# Patient Record
Sex: Male | Born: 1945 | Race: White | Hispanic: No | Marital: Married | Smoking: Former smoker
Health system: Southern US, Community
[De-identification: ages and names within clinical notes are randomized; demographics above are authoritative.]

## PROBLEM LIST (undated history)

## (undated) DIAGNOSIS — K5790 Diverticulosis of intestine, part unspecified, without perforation or abscess without bleeding: Secondary | ICD-10-CM

## (undated) DIAGNOSIS — Z9289 Personal history of other medical treatment: Secondary | ICD-10-CM

## (undated) DIAGNOSIS — N400 Enlarged prostate without lower urinary tract symptoms: Secondary | ICD-10-CM

## (undated) DIAGNOSIS — H35349 Macular cyst, hole, or pseudohole, unspecified eye: Secondary | ICD-10-CM

## (undated) DIAGNOSIS — Z85828 Personal history of other malignant neoplasm of skin: Secondary | ICD-10-CM

## (undated) DIAGNOSIS — J9383 Other pneumothorax: Secondary | ICD-10-CM

## (undated) DIAGNOSIS — G4730 Sleep apnea, unspecified: Secondary | ICD-10-CM

## (undated) DIAGNOSIS — N3281 Overactive bladder: Secondary | ICD-10-CM

## (undated) DIAGNOSIS — Z720 Tobacco use: Secondary | ICD-10-CM

## (undated) DIAGNOSIS — K922 Gastrointestinal hemorrhage, unspecified: Secondary | ICD-10-CM

## (undated) DIAGNOSIS — K219 Gastro-esophageal reflux disease without esophagitis: Secondary | ICD-10-CM

## (undated) HISTORY — PX: CYST EXCISION: SHX5701

## (undated) HISTORY — PX: OTHER SURGICAL HISTORY: SHX169

## (undated) HISTORY — PX: COLONOSCOPY: SHX174

---
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
## 2012-05-31 DIAGNOSIS — C61 Malignant neoplasm of prostate: Secondary | ICD-10-CM | POA: Insufficient documentation

## 2012-09-25 DIAGNOSIS — K5730 Diverticulosis of large intestine without perforation or abscess without bleeding: Secondary | ICD-10-CM | POA: Insufficient documentation

## 2013-03-27 ENCOUNTER — Encounter (INDEPENDENT_AMBULATORY_CARE_PROVIDER_SITE_OTHER): Payer: Medicare Other | Admitting: Ophthalmology

## 2013-03-27 DIAGNOSIS — H43819 Vitreous degeneration, unspecified eye: Secondary | ICD-10-CM

## 2013-03-27 DIAGNOSIS — H2510 Age-related nuclear cataract, unspecified eye: Secondary | ICD-10-CM

## 2013-03-27 DIAGNOSIS — H35439 Paving stone degeneration of retina, unspecified eye: Secondary | ICD-10-CM

## 2013-03-27 DIAGNOSIS — H3530 Unspecified macular degeneration: Secondary | ICD-10-CM

## 2013-05-01 ENCOUNTER — Ambulatory Visit: Payer: Self-pay | Admitting: Family Medicine

## 2014-04-02 ENCOUNTER — Encounter (INDEPENDENT_AMBULATORY_CARE_PROVIDER_SITE_OTHER): Payer: Medicare Other | Admitting: Ophthalmology

## 2014-04-02 DIAGNOSIS — H43813 Vitreous degeneration, bilateral: Secondary | ICD-10-CM

## 2014-04-02 DIAGNOSIS — H3531 Nonexudative age-related macular degeneration: Secondary | ICD-10-CM

## 2014-04-02 DIAGNOSIS — H35342 Macular cyst, hole, or pseudohole, left eye: Secondary | ICD-10-CM

## 2014-04-03 NOTE — H&P (Signed)
Patrick Shepherd is an 68 y.o. male.   Chief Complaint:severe loss of vision left eye HPI: lost vision left eye over past 4 months  No past medical history on file.  No past surgical history on file.  No family history on file. Social History:  has no tobacco, alcohol, and drug history on file.  Allergies: Allergies not on file  No prescriptions prior to admission    Review of systems otherwise negative  There were no vitals taken for this visit.  Physical exam: Mental status: oriented x3. Eyes: See eye exam associated with this date of surgery in media tab.  Scanned in by scanning center Ears, Nose, Throat: within normal limits Neck: Within Normal limits General: within normal limits Chest: Within normal limits Breast: deferred Heart: Within normal limits Abdomen: Within normal limits GU: deferred Extremities: within normal limits Skin: within normal limits  Assessment/Plan Macular hole left eye Plan: To [facility] for Pars plana vitrectomy, membrane peel, laser, serum patch, gas injection left eye  Patrick Shepherd D 04/03/2014, 7:32 AM

## 2014-04-20 ENCOUNTER — Encounter (HOSPITAL_COMMUNITY): Payer: Self-pay | Admitting: *Deleted

## 2014-04-20 MED ORDER — TROPICAMIDE 1 % OP SOLN
1.0000 [drp] | OPHTHALMIC | Status: DC | PRN
  Filled 2014-04-20: qty 3

## 2014-04-20 MED ORDER — CYCLOPENTOLATE HCL 1 % OP SOLN
1.0000 [drp] | OPHTHALMIC | Status: DC | PRN
  Filled 2014-04-20: qty 2

## 2014-04-20 MED ORDER — CEFAZOLIN SODIUM-DEXTROSE 2-3 GM-% IV SOLR
2.0000 g | INTRAVENOUS | Status: AC
  Administered 2014-04-21: 2 g via INTRAVENOUS
  Filled 2014-04-20: qty 50

## 2014-04-20 MED ORDER — GATIFLOXACIN 0.5 % OP SOLN
1.0000 [drp] | OPHTHALMIC | Status: DC | PRN
  Filled 2014-04-20: qty 2.5

## 2014-04-20 MED ORDER — PHENYLEPHRINE HCL 2.5 % OP SOLN
1.0000 [drp] | OPHTHALMIC | Status: DC | PRN
  Filled 2014-04-20: qty 2

## 2014-04-21 ENCOUNTER — Encounter (HOSPITAL_COMMUNITY): Payer: Self-pay | Admitting: *Deleted

## 2014-04-21 ENCOUNTER — Ambulatory Visit (HOSPITAL_COMMUNITY): Payer: Medicare Other | Admitting: Anesthesiology

## 2014-04-21 ENCOUNTER — Ambulatory Visit (HOSPITAL_COMMUNITY)
Admission: RE | Admit: 2014-04-21 | Discharge: 2014-04-22 | Disposition: A | Discharge status: Home / Self Care | Payer: Medicare Other | Source: Ambulatory Visit | Attending: Ophthalmology | Admitting: Ophthalmology

## 2014-04-21 ENCOUNTER — Encounter (HOSPITAL_COMMUNITY)
Admission: RE | Disposition: A | Discharge status: Home / Self Care | Payer: Self-pay | Source: Ambulatory Visit | Attending: Ophthalmology

## 2014-04-21 DIAGNOSIS — F17210 Nicotine dependence, cigarettes, uncomplicated: Secondary | ICD-10-CM | POA: Diagnosis not present

## 2014-04-21 DIAGNOSIS — H35342 Macular cyst, hole, or pseudohole, left eye: Secondary | ICD-10-CM | POA: Diagnosis not present

## 2014-04-21 DIAGNOSIS — K219 Gastro-esophageal reflux disease without esophagitis: Secondary | ICD-10-CM | POA: Diagnosis not present

## 2014-04-21 DIAGNOSIS — G4730 Sleep apnea, unspecified: Secondary | ICD-10-CM | POA: Insufficient documentation

## 2014-04-21 DIAGNOSIS — H35349 Macular cyst, hole, or pseudohole, unspecified eye: Secondary | ICD-10-CM

## 2014-04-21 HISTORY — PX: 25 GAUGE PARS PLANA VITRECTOMY WITH 20 GAUGE MVR PORT FOR MACULAR HOLE: SHX6096

## 2014-04-21 HISTORY — PX: GAS/FLUID EXCHANGE: SHX5334

## 2014-04-21 HISTORY — PX: MEMBRANE PEEL: SHX5967

## 2014-04-21 HISTORY — DX: Benign prostatic hyperplasia without lower urinary tract symptoms: N40.0

## 2014-04-21 HISTORY — PX: SERUM PATCH: SHX6091

## 2014-04-21 HISTORY — DX: Sleep apnea, unspecified: G47.30

## 2014-04-21 HISTORY — DX: Overactive bladder: N32.81

## 2014-04-21 HISTORY — DX: Gastro-esophageal reflux disease without esophagitis: K21.9

## 2014-04-21 HISTORY — DX: Personal history of other medical treatment: Z92.89

## 2014-04-21 LAB — BASIC METABOLIC PANEL
Anion gap: 13 (ref 5–15)
BUN: 10 mg/dL (ref 6–23)
CHLORIDE: 104 meq/L (ref 96–112)
CO2: 24 mEq/L (ref 19–32)
Calcium: 9.5 mg/dL (ref 8.4–10.5)
Creatinine, Ser: 0.93 mg/dL (ref 0.50–1.35)
GFR calc Af Amer: >90 mL/min (ref >90)
GFR, EST NON AFRICAN AMERICAN: 84 mL/min — AB (ref >90)
GLUCOSE: 103 mg/dL — AB (ref 70–99)
POTASSIUM: 4.4 meq/L (ref 3.7–5.3)
SODIUM: 141 meq/L (ref 137–147)

## 2014-04-21 LAB — AUTOLOGOUS SERUM PATCH PREP

## 2014-04-21 LAB — CBC
HCT: 46.2 % (ref 39.0–52.0)
HEMOGLOBIN: 16.3 g/dL (ref 13.0–17.0)
MCH: 34.3 pg — ABNORMAL HIGH (ref 26.0–34.0)
MCHC: 35.3 g/dL (ref 30.0–36.0)
MCV: 97.3 fL (ref 78.0–100.0)
Platelets: 195 10*3/uL (ref 150–400)
RBC: 4.75 MIL/uL (ref 4.22–5.81)
RDW: 13 % (ref 11.5–15.5)
WBC: 5.5 10*3/uL (ref 4.0–10.5)

## 2014-04-21 SURGERY — 25 GAUGE PARS PLANA VITRECTOMY WITH 20 GAUGE MVR PORT FOR MACULAR HOLE
Anesthesia: General | Site: Eye | Laterality: Left

## 2014-04-21 MED ORDER — SODIUM HYALURONATE 10 MG/ML IO SOLN
INTRAOCULAR | Status: DC | PRN
  Administered 2014-04-21: 0.85 mL via INTRAOCULAR

## 2014-04-21 MED ORDER — SODIUM CHLORIDE 0.9 % IV SOLN
INTRAVENOUS | Status: DC | PRN
  Administered 2014-04-21: 14:00:00 via INTRAVENOUS

## 2014-04-21 MED ORDER — SODIUM HYALURONATE 10 MG/ML IO SOLN
INTRAOCULAR | Status: AC
  Filled 2014-04-21: qty 0.85

## 2014-04-21 MED ORDER — NEOSTIGMINE METHYLSULFATE 10 MG/10ML IV SOLN
INTRAVENOUS | Status: DC | PRN
  Administered 2014-04-21: 3 mg via INTRAVENOUS

## 2014-04-21 MED ORDER — PROPOFOL 10 MG/ML IV BOLUS
INTRAVENOUS | Status: AC
  Filled 2014-04-21: qty 20

## 2014-04-21 MED ORDER — SODIUM CHLORIDE 0.9 % IV SOLN: INTRAVENOUS | Status: DC

## 2014-04-21 MED ORDER — GLYCOPYRROLATE 0.2 MG/ML IJ SOLN
INTRAMUSCULAR | Status: AC
  Filled 2014-04-21: qty 1

## 2014-04-21 MED ORDER — BUPIVACAINE HCL (PF) 0.75 % IJ SOLN
INTRAMUSCULAR | Status: AC
  Filled 2014-04-21: qty 10

## 2014-04-21 MED ORDER — GATIFLOXACIN 0.5 % OP SOLN
1.0000 [drp] | Freq: Four times a day (QID) | OPHTHALMIC | Status: DC
  Filled 2014-04-21: qty 2.5

## 2014-04-21 MED ORDER — MEPERIDINE HCL 25 MG/ML IJ SOLN: 6.2500 mg | INTRAMUSCULAR | Status: DC | PRN

## 2014-04-21 MED ORDER — TAMSULOSIN HCL 0.4 MG PO CAPS
0.4000 mg | ORAL_CAPSULE | Freq: Every day | ORAL | Status: DC
  Administered 2014-04-21: 0.4 mg via ORAL
  Filled 2014-04-21 (×2): qty 1

## 2014-04-21 MED ORDER — DEXAMETHASONE SODIUM PHOSPHATE 4 MG/ML IJ SOLN
INTRAMUSCULAR | Status: AC
  Filled 2014-04-21: qty 2

## 2014-04-21 MED ORDER — TROPICAMIDE 1 % OP SOLN
1.0000 [drp] | OPHTHALMIC | Status: AC | PRN
  Administered 2014-04-21 (×3): 1 [drp] via OPHTHALMIC

## 2014-04-21 MED ORDER — MORPHINE SULFATE 2 MG/ML IJ SOLN
1.0000 mg | INTRAMUSCULAR | Status: DC | PRN
  Administered 2014-04-21: 2 mg via INTRAVENOUS
  Filled 2014-04-21: qty 1

## 2014-04-21 MED ORDER — ONDANSETRON HCL 4 MG/2ML IJ SOLN
INTRAMUSCULAR | Status: DC | PRN
  Administered 2014-04-21: 4 mg via INTRAVENOUS

## 2014-04-21 MED ORDER — ROCURONIUM BROMIDE 50 MG/5ML IV SOLN
INTRAVENOUS | Status: AC
  Filled 2014-04-21: qty 1

## 2014-04-21 MED ORDER — POLYMYXIN B SULFATE 500000 UNITS IJ SOLR
INTRAMUSCULAR | Status: AC
  Filled 2014-04-21: qty 1

## 2014-04-21 MED ORDER — TEMAZEPAM 15 MG PO CAPS: 15.0000 mg | ORAL_CAPSULE | Freq: Every evening | ORAL | Status: DC | PRN

## 2014-04-21 MED ORDER — GLYCOPYRROLATE 0.2 MG/ML IJ SOLN
INTRAMUSCULAR | Status: DC | PRN
  Administered 2014-04-21: 0.4 mg via INTRAVENOUS

## 2014-04-21 MED ORDER — MIDAZOLAM HCL 2 MG/2ML IJ SOLN: 0.5000 mg | Freq: Once | INTRAMUSCULAR | Status: DC | PRN

## 2014-04-21 MED ORDER — PROMETHAZINE HCL 25 MG/ML IJ SOLN: 6.2500 mg | INTRAMUSCULAR | Status: DC | PRN

## 2014-04-21 MED ORDER — OXYCODONE HCL 5 MG/5ML PO SOLN: 5.0000 mg | Freq: Once | ORAL | Status: DC | PRN

## 2014-04-21 MED ORDER — GATIFLOXACIN 0.5 % OP SOLN
1.0000 [drp] | OPHTHALMIC | Status: AC | PRN
  Administered 2014-04-21 (×3): 1 [drp] via OPHTHALMIC

## 2014-04-21 MED ORDER — BACITRACIN-POLYMYXIN B 500-10000 UNIT/GM OP OINT
TOPICAL_OINTMENT | OPHTHALMIC | Status: DC | PRN
  Administered 2014-04-21: 1 via OPHTHALMIC

## 2014-04-21 MED ORDER — GENTAMICIN SULFATE 40 MG/ML IJ SOLN
INTRAMUSCULAR | Status: AC
  Filled 2014-04-21: qty 2

## 2014-04-21 MED ORDER — EPINEPHRINE HCL 1 MG/ML IJ SOLN
INTRAOCULAR | Status: DC | PRN
  Administered 2014-04-21: 500 mL

## 2014-04-21 MED ORDER — MAGNESIUM HYDROXIDE 400 MG/5ML PO SUSP
15.0000 mL | Freq: Four times a day (QID) | ORAL | Status: DC | PRN
  Administered 2014-04-21: 30 mL via ORAL
  Filled 2014-04-21: qty 30

## 2014-04-21 MED ORDER — BSS PLUS IO SOLN
INTRAOCULAR | Status: AC
  Filled 2014-04-21: qty 500

## 2014-04-21 MED ORDER — PANTOPRAZOLE SODIUM 40 MG PO TBEC
40.0000 mg | DELAYED_RELEASE_TABLET | Freq: Every day | ORAL | Status: DC
  Administered 2014-04-21: 40 mg via ORAL
  Filled 2014-04-21: qty 1

## 2014-04-21 MED ORDER — ACETAMINOPHEN 325 MG PO TABS: 325.0000 mg | ORAL_TABLET | ORAL | Status: DC | PRN

## 2014-04-21 MED ORDER — FENTANYL CITRATE 0.05 MG/ML IJ SOLN
INTRAMUSCULAR | Status: AC
  Filled 2014-04-21: qty 5

## 2014-04-21 MED ORDER — BACITRACIN-POLYMYXIN B 500-10000 UNIT/GM OP OINT
TOPICAL_OINTMENT | OPHTHALMIC | Status: AC
  Filled 2014-04-21: qty 3.5

## 2014-04-21 MED ORDER — ONDANSETRON HCL 4 MG/2ML IJ SOLN
INTRAMUSCULAR | Status: AC
  Filled 2014-04-21: qty 2

## 2014-04-21 MED ORDER — ROCURONIUM BROMIDE 100 MG/10ML IV SOLN
INTRAVENOUS | Status: DC | PRN
  Administered 2014-04-21: 35 mg via INTRAVENOUS

## 2014-04-21 MED ORDER — TETRACAINE HCL 0.5 % OP SOLN
2.0000 [drp] | Freq: Once | OPHTHALMIC | Status: DC
  Filled 2014-04-21: qty 2

## 2014-04-21 MED ORDER — DEXAMETHASONE SODIUM PHOSPHATE 10 MG/ML IJ SOLN
INTRAMUSCULAR | Status: AC
  Filled 2014-04-21: qty 1

## 2014-04-21 MED ORDER — EPINEPHRINE HCL 1 MG/ML IJ SOLN
INTRAMUSCULAR | Status: AC
  Filled 2014-04-21: qty 1

## 2014-04-21 MED ORDER — 0.9 % SODIUM CHLORIDE (POUR BTL) OPTIME
TOPICAL | Status: DC | PRN
  Administered 2014-04-21: 1000 mL

## 2014-04-21 MED ORDER — MIDAZOLAM HCL 5 MG/5ML IJ SOLN
INTRAMUSCULAR | Status: DC | PRN
  Administered 2014-04-21: 2 mg via INTRAVENOUS

## 2014-04-21 MED ORDER — DOCUSATE SODIUM 100 MG PO CAPS
100.0000 mg | ORAL_CAPSULE | Freq: Two times a day (BID) | ORAL | Status: DC
  Administered 2014-04-21: 100 mg via ORAL
  Filled 2014-04-21: qty 1

## 2014-04-21 MED ORDER — PHENYLEPHRINE HCL 10 MG/ML IJ SOLN
INTRAMUSCULAR | Status: DC | PRN
  Administered 2014-04-21 (×6): 80 ug via INTRAVENOUS

## 2014-04-21 MED ORDER — CYCLOPENTOLATE HCL 1 % OP SOLN
1.0000 [drp] | OPHTHALMIC | Status: AC | PRN
  Administered 2014-04-21 (×3): 1 [drp] via OPHTHALMIC

## 2014-04-21 MED ORDER — FENTANYL CITRATE 0.05 MG/ML IJ SOLN
INTRAMUSCULAR | Status: DC | PRN
  Administered 2014-04-21 (×2): 100 ug via INTRAVENOUS
  Administered 2014-04-21: 50 ug via INTRAVENOUS

## 2014-04-21 MED ORDER — LATANOPROST 0.005 % OP SOLN
1.0000 [drp] | Freq: Every day | OPHTHALMIC | Status: DC
  Filled 2014-04-21: qty 2.5

## 2014-04-21 MED ORDER — SODIUM CHLORIDE 0.45 % IV SOLN
INTRAVENOUS | Status: DC
  Administered 2014-04-21: 17:00:00 via INTRAVENOUS

## 2014-04-21 MED ORDER — ACETAZOLAMIDE SODIUM 500 MG IJ SOLR
500.0000 mg | Freq: Once | INTRAMUSCULAR | Status: AC
  Administered 2014-04-22: 500 mg via INTRAVENOUS
  Filled 2014-04-21: qty 500

## 2014-04-21 MED ORDER — BACITRACIN-POLYMYXIN B 500-10000 UNIT/GM OP OINT
1.0000 "application " | TOPICAL_OINTMENT | Freq: Three times a day (TID) | OPHTHALMIC | Status: DC
  Filled 2014-04-21: qty 3.5

## 2014-04-21 MED ORDER — SODIUM CHLORIDE 0.9 % IJ SOLN
INTRAMUSCULAR | Status: AC
  Filled 2014-04-21: qty 10

## 2014-04-21 MED ORDER — ONDANSETRON HCL 4 MG/2ML IJ SOLN
4.0000 mg | Freq: Four times a day (QID) | INTRAMUSCULAR | Status: DC
  Administered 2014-04-21 – 2014-04-22 (×2): 4 mg via INTRAVENOUS
  Filled 2014-04-21 (×2): qty 2

## 2014-04-21 MED ORDER — LACTATED RINGERS IV SOLN
INTRAVENOUS | Status: DC | PRN
  Administered 2014-04-21: 14:00:00 via INTRAVENOUS

## 2014-04-21 MED ORDER — ATROPINE SULFATE 1 % OP SOLN
OPHTHALMIC | Status: AC
  Filled 2014-04-21: qty 2

## 2014-04-21 MED ORDER — BUPIVACAINE HCL (PF) 0.75 % IJ SOLN
INTRAMUSCULAR | Status: DC | PRN
  Administered 2014-04-21: 10 mL

## 2014-04-21 MED ORDER — FENTANYL CITRATE 0.05 MG/ML IJ SOLN: 25.0000 ug | INTRAMUSCULAR | Status: DC | PRN

## 2014-04-21 MED ORDER — MIDAZOLAM HCL 2 MG/2ML IJ SOLN
INTRAMUSCULAR | Status: AC
  Filled 2014-04-21: qty 2

## 2014-04-21 MED ORDER — BRIMONIDINE TARTRATE 0.2 % OP SOLN
1.0000 [drp] | Freq: Two times a day (BID) | OPHTHALMIC | Status: DC
  Filled 2014-04-21: qty 5

## 2014-04-21 MED ORDER — PREDNISOLONE ACETATE 1 % OP SUSP
1.0000 [drp] | Freq: Four times a day (QID) | OPHTHALMIC | Status: DC
  Filled 2014-04-21: qty 5
  Filled 2014-04-21: qty 1

## 2014-04-21 MED ORDER — HYDROCODONE-ACETAMINOPHEN 5-325 MG PO TABS
1.0000 | ORAL_TABLET | ORAL | Status: DC | PRN
  Administered 2014-04-21: 2 via ORAL
  Filled 2014-04-21: qty 2

## 2014-04-21 MED ORDER — LIDOCAINE HCL (CARDIAC) 20 MG/ML IV SOLN
INTRAVENOUS | Status: AC
  Filled 2014-04-21: qty 5

## 2014-04-21 MED ORDER — OXYCODONE HCL 5 MG PO TABS: 5.0000 mg | ORAL_TABLET | Freq: Once | ORAL | Status: DC | PRN

## 2014-04-21 MED ORDER — PHENYLEPHRINE HCL 2.5 % OP SOLN
1.0000 [drp] | OPHTHALMIC | Status: AC | PRN
  Administered 2014-04-21 (×3): 1 [drp] via OPHTHALMIC

## 2014-04-21 MED ORDER — TRIAMCINOLONE ACETONIDE 40 MG/ML IJ SUSP
INTRAMUSCULAR | Status: AC
  Filled 2014-04-21: qty 5

## 2014-04-21 MED ORDER — DEXAMETHASONE SODIUM PHOSPHATE 10 MG/ML IJ SOLN
INTRAMUSCULAR | Status: DC | PRN
  Administered 2014-04-21: 10 mg via INTRAVENOUS

## 2014-04-21 MED ORDER — PROPOFOL 10 MG/ML IV BOLUS
INTRAVENOUS | Status: DC | PRN
  Administered 2014-04-21: 150 mg via INTRAVENOUS

## 2014-04-21 MED ORDER — LIDOCAINE HCL (CARDIAC) 20 MG/ML IV SOLN
INTRAVENOUS | Status: DC | PRN
  Administered 2014-04-21: 20 mg via INTRAVENOUS

## 2014-04-21 MED ORDER — DARIFENACIN HYDROBROMIDE ER 15 MG PO TB24
15.0000 mg | ORAL_TABLET | Freq: Every day | ORAL | Status: DC
  Administered 2014-04-21: 15 mg via ORAL
  Filled 2014-04-21 (×2): qty 1

## 2014-04-21 SURGICAL SUPPLY — 45 items
BLADE MVR KNIFE 20G (BLADE) ×3 IMPLANT
CANNULA VLV SOFT TIP 25GA (OPHTHALMIC) ×3 IMPLANT
CORDS BIPOLAR (ELECTRODE) ×3 IMPLANT
COTTONBALL LRG STERILE PKG (GAUZE/BANDAGES/DRESSINGS) ×9 IMPLANT
COVER MAYO STAND STRL (DRAPES) ×3 IMPLANT
DRAPE INCISE 51X51 W/FILM STRL (DRAPES) ×3 IMPLANT
DRAPE OPHTHALMIC 77X100 STRL (CUSTOM PROCEDURE TRAY) ×3 IMPLANT
FILTER STRAW FLUID ASPIR (MISCELLANEOUS) ×3 IMPLANT
GAS AUTO FILL CONSTEL (OPHTHALMIC) ×3
GAS AUTO FILL CONSTELLATION (OPHTHALMIC) ×1 IMPLANT
GLOVE SS BIOGEL STRL SZ 6.5 (GLOVE) ×1 IMPLANT
GLOVE SS BIOGEL STRL SZ 7 (GLOVE) ×1 IMPLANT
GLOVE SUPERSENSE BIOGEL SZ 6.5 (GLOVE) ×2
GLOVE SUPERSENSE BIOGEL SZ 7 (GLOVE) ×2
GLOVE SURG 8.5 LATEX PF (GLOVE) ×3 IMPLANT
GLOVE SURG SS PI 7.0 STRL IVOR (GLOVE) ×3 IMPLANT
GOWN STRL REUS W/ TWL LRG LVL3 (GOWN DISPOSABLE) ×3 IMPLANT
GOWN STRL REUS W/TWL LRG LVL3 (GOWN DISPOSABLE) ×6
KIT BASIN OR (CUSTOM PROCEDURE TRAY) ×3 IMPLANT
KIT ROOM TURNOVER OR (KITS) ×3 IMPLANT
NEEDLE 18GX1X1/2 (RX/OR ONLY) (NEEDLE) ×3 IMPLANT
NEEDLE 25GX 5/8IN NON SAFETY (NEEDLE) ×3 IMPLANT
NEEDLE FILTER BLUNT 18X 1/2SAF (NEEDLE) ×2
NEEDLE FILTER BLUNT 18X1 1/2 (NEEDLE) ×1 IMPLANT
NEEDLE HYPO 30X.5 LL (NEEDLE) ×9 IMPLANT
NS IRRIG 1000ML POUR BTL (IV SOLUTION) ×3 IMPLANT
PACK VITRECTOMY CUSTOM (CUSTOM PROCEDURE TRAY) ×3 IMPLANT
PAD ARMBOARD 7.5X6 YLW CONV (MISCELLANEOUS) ×6 IMPLANT
PAK PIK VITRECTOMY CVS 25GA (OPHTHALMIC) ×3 IMPLANT
PIC ILLUMINATED 25G (OPHTHALMIC) ×3
PIK ILLUMINATED 25G (OPHTHALMIC) ×1 IMPLANT
PROBE LASER ILLUM FLEX CVD 25G (OPHTHALMIC) ×3 IMPLANT
REPL STRA BRUSH NEEDLE (NEEDLE) ×3 IMPLANT
RESERVOIR BACK FLUSH (MISCELLANEOUS) ×3 IMPLANT
ROLLS DENTAL (MISCELLANEOUS) ×6 IMPLANT
SCRAPER DIAMOND DUST MEMBRANE (MISCELLANEOUS) ×3 IMPLANT
SPONGE SURGIFOAM ABS GEL 12-7 (HEMOSTASIS) ×3 IMPLANT
SUT ETHILON 9 0 TG140 8 (SUTURE) ×3 IMPLANT
SYR 20CC LL (SYRINGE) ×3 IMPLANT
SYR BULB 3OZ (MISCELLANEOUS) ×3 IMPLANT
SYR TB 1ML LUER SLIP (SYRINGE) ×3 IMPLANT
SYRINGE 10CC LL (SYRINGE) ×3 IMPLANT
TOWEL OR 17X26 10 PK STRL BLUE (TOWEL DISPOSABLE) ×3 IMPLANT
WATER STERILE IRR 1000ML POUR (IV SOLUTION) ×3 IMPLANT
WIPE INSTRUMENT VISIWIPE 73X73 (MISCELLANEOUS) ×3 IMPLANT

## 2014-04-21 NOTE — Progress Notes (Signed)
Care of pt assumed by MA Alwilda Gilland RN 

## 2014-04-21 NOTE — Brief Op Note (Signed)
Brief Operative note   Preoperative diagnosis:  Macular hole left eye Postoperative diagnosis  Macular hole left eye  Procedures: Pars plana vitrectomy, membrane peel, laser, gas injection left eye  Surgeon:  Hayden Pedro, MD...  Assistant:  Deatra Ina SA  Anesthesia: General  Specimen: none  Estimated blood loss:  1cc  Complications: none  Patient sent to PACU in good condition  Composed by Hayden Pedro MD  Dictation number: [id number]

## 2014-04-21 NOTE — Op Note (Signed)
NAME:  Patrick Shepherd, Patrick Shepherd NO.:  [id number]  MEDICAL RECORD NO.:  [id number]  LOCATION:  [id number]                        FACILITY:  [facility]  PHYSICIAN:  Chrystie Nose. Zigmund Daniel, M.D. DATE OF BIRTH:  [birth year]  DATE OF PROCEDURE:  04/21/2014 DATE OF DISCHARGE:                              OPERATIVE REPORT   ADMISSION DIAGNOSIS:  Macular hole, left eye.  PROCEDURES:  Pars plana vitrectomy, retinal photocoagulation, membrane peel, internal limiting membrane peeling, gas fluid exchange, serum patch; all in the left eye.  SURGEON:  Chrystie Nose. Zigmund Daniel, M.D.  ASSISTANT:  Deatra Ina, SA.  ANESTHESIA:  General.  DETAILS:  Usual prep and drape, the indirect ophthalmoscope laser was moved into place.  722 burns were placed around the retinal periphery with a power between 400 and 600 mW, 1000 microns each and 0.1 seconds each.  The laser burns were placed around weak areas of peripheral retina especially at 6 o'clock and 3 o'clock.  The attention was then carried to the pars plana where a conjunctival peritomy was performed at 2 o'clock.  A 3-layered scleral incision was made at 2 o'clock with a diamond knife, a 20-gauge MVR incision at 2 o'clock.  The 25-gauge trocars placed at 4 o'clock and 10 o'clock with infusion at 4 o'clock. Contact lens ring anchored into place at 6 and 12 o'clock.  Provisc placed on the corneal surface and the flat contact lens was placed. Pars plana vitrectomy was begun just behind the cataractous lens.  The view was a bit hazy and milky because of the cataract.  Vitrectomy was carried down to the macular surface where posterior membranes were encountered.  A core vitrectomy was completed.  A 30-degree prismatic lens was moved into place on the corneal surface.  The vitrectomy was carried into the mid periphery where vitrectomy was performed for 360 degrees.  Then, the vitrectomy was carried out into the far periphery down to the vitreous base for 360  degrees.  The flat contact lens was then placed onto a layer of methylcellulose onto the cornea.  The macular hole was inspected.  It was small and yellow in color.  The diamond-dusted membrane scraper was used to engage the internal limiting membrane for 360 degrees around the hole and peel it from its attachment to the edge of the hole and to the macular region.  The vitreous cutter was used to remove these remnants from the vitreous cavity.  Once the edges of the hole were totally loosened, a total gas-fluid exchange was carried out.  Sufficient time was allowed for additional fluid to track down the walls of the eye and collect in the posterior segment.  Serum patch and C3F8 14% was prepared during this time.  Additional fluid was removed from the posterior segment with a [place] ophthalmics brush until the entire retina was dry.  Serum patch was delivered. Excessive serum was then removed with a [place] ophthalmics brush.  C3F8 14% was exchanged for intravitreal gas.  The instruments were removed from the eye and the 25-gauge trocars were removed.  The sclerotomy was closed with 9-0 nylon at 2 o'clock.  The conjunctiva was closed with wet-field cautery. Polymyxin and gentamicin were irrigated into  [place] space.  Closing pressure was 10 with a Barraquer tonometer.  Atropine solution was applied. Decadron 10 mg was injected into the lower subconjunctival space. Marcaine was injected around the globe for postop pain.  Polysporin ophthalmic ointment, a patch and shield were placed.  The patient was awakened, taken to recovery room in satisfactory condition.     Chrystie Nose. Zigmund Daniel, M.D.     JDM/MEDQ  D:  04/21/2014  T:  04/21/2014  Job:  [id number]

## 2014-04-21 NOTE — Progress Notes (Signed)
Report given to maryann rn as caregiver 

## 2014-04-21 NOTE — Transfer of Care (Signed)
Immediate Anesthesia Transfer of Care Note  Patient: DEMONE LYLES  Procedure(s) Performed: Procedure(s): 25 GAUGE PARS PLANA VITRECTOMY WITH 20 GAUGE MVR PORT FOR MACULAR HOLE (Left) GAS/FLUID EXCHANGE (Left) SERUM PATCH (Left) MEMBRANE PEEL (Left)  Patient Location: PACU  Anesthesia Type:General  Level of Consciousness: sedated, patient cooperative and responds to stimulation  Airway & Oxygen Therapy: Patient Spontanous Breathing and Patient connected to nasal cannula oxygen  Post-op Assessment: Report given to PACU RN, Post -op Vital signs reviewed and stable and Patient moving all extremities  Post vital signs: Reviewed and stable  Complications: No apparent anesthesia complications

## 2014-04-21 NOTE — Plan of Care (Signed)
Problem: Phase I Progression Outcomes Goal: Patient positioned per MD order Outcome: Completed/Met Date Met:  04/21/14

## 2014-04-21 NOTE — Anesthesia Preprocedure Evaluation (Addendum)
Anesthesia Evaluation  Patient identified by MRN, date of birth, ID band Patient awake    Reviewed: Allergy & Precautions, H&P , NPO status , Patient's Chart, lab work & pertinent test results  History of Anesthesia Complications Negative for: history of anesthetic complications  Airway Mallampati: II  TM Distance: >3 FB Neck ROM: Full    Dental  (+) Edentulous Upper, Dental Advisory Given   Pulmonary sleep apnea and Continuous Positive Airway Pressure Ventilation , Current Smoker,  breath sounds clear to auscultation        Cardiovascular - anginanegative cardio ROS  Rhythm:Regular Rate:Normal     Neuro/Psych negative neurological ROS     GI/Hepatic Neg liver ROS, GERD-  Medicated and Controlled,  Endo/Other  negative endocrine ROS  Renal/GU negative Renal ROS     Musculoskeletal   Abdominal   Peds  Hematology negative hematology ROS (+)   Anesthesia Other Findings   Reproductive/Obstetrics                            Anesthesia Physical Anesthesia Plan  ASA: II  Anesthesia Plan: General   Post-op Pain Management:    Induction: Intravenous  Airway Management Planned: Oral ETT  Additional Equipment:   Intra-op Plan:   Post-operative Plan: Extubation in OR  Informed Consent: I have reviewed the patients History and Physical, chart, labs and discussed the procedure including the risks, benefits and alternatives for the proposed anesthesia with the patient or authorized representative who has indicated his/her understanding and acceptance.   Dental advisory given  Plan Discussed with: CRNA and Surgeon  Anesthesia Plan Comments: (Plan routine monitors, GETA)        Anesthesia Quick Evaluation

## 2014-04-21 NOTE — H&P (Signed)
I examined the patient today and there is no change in the medical status 

## 2014-04-21 NOTE — Anesthesia Postprocedure Evaluation (Signed)
  Anesthesia Post-op Note  Patient: Patrick Shepherd  Procedure(s) Performed: Procedure(s): 25 GAUGE PARS PLANA VITRECTOMY WITH 20 GAUGE MVR PORT FOR MACULAR HOLE (Left) GAS/FLUID EXCHANGE (Left) SERUM PATCH (Left) MEMBRANE PEEL (Left)  Patient Location: PACU  Anesthesia Type:General  Level of Consciousness: awake, alert , oriented and patient cooperative  Airway and Oxygen Therapy: Patient Spontanous Breathing  Post-op Pain: none  Post-op Assessment: Post-op Vital signs reviewed, Patient's Cardiovascular Status Stable, Respiratory Function Stable, Patent Airway, No signs of Nausea or vomiting and Pain level controlled  Post-op Vital Signs: Reviewed and stable  Last Vitals:  Filed Vitals:   04/21/14 1609  BP: 126/56  Pulse: 58  Temp:   Resp: 15    Complications: No apparent anesthesia complications

## 2014-04-22 ENCOUNTER — Encounter (HOSPITAL_COMMUNITY): Payer: Self-pay | Admitting: Ophthalmology

## 2014-04-22 DIAGNOSIS — H35342 Macular cyst, hole, or pseudohole, left eye: Secondary | ICD-10-CM | POA: Diagnosis not present

## 2014-04-22 MED ORDER — GATIFLOXACIN 0.5 % OP SOLN: 1.0000 [drp] | Freq: Four times a day (QID) | OPHTHALMIC | Status: DC

## 2014-04-22 MED ORDER — PREDNISOLONE ACETATE 1 % OP SUSP: 1.0000 [drp] | Freq: Four times a day (QID) | OPHTHALMIC | Status: DC

## 2014-04-22 MED ORDER — BACITRACIN-POLYMYXIN B 500-10000 UNIT/GM OP OINT: 1.0000 "application " | TOPICAL_OINTMENT | Freq: Three times a day (TID) | OPHTHALMIC | Status: DC

## 2014-04-22 NOTE — Discharge Summary (Signed)
Discharge summary not needed on [facility] patients per medical records. 

## 2014-04-22 NOTE — Progress Notes (Signed)
Pt discharged to home with wife in stable condition. Discharge instructions given, questions and concerns answered. IV discontinued.

## 2014-04-22 NOTE — Progress Notes (Signed)
RN received report from night shift. Pt received discharge education by night shift RN. Pt denies any other questions at this time.

## 2014-04-22 NOTE — Progress Notes (Signed)
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  Discharge to home.  Give post operative instructions  Patrick Shepherd 04/22/2014, 6:36 AM

## 2014-04-29 ENCOUNTER — Ambulatory Visit (INDEPENDENT_AMBULATORY_CARE_PROVIDER_SITE_OTHER): Payer: Medicare Other | Admitting: Ophthalmology

## 2014-04-29 DIAGNOSIS — H35342 Macular cyst, hole, or pseudohole, left eye: Secondary | ICD-10-CM

## 2014-05-20 ENCOUNTER — Encounter (INDEPENDENT_AMBULATORY_CARE_PROVIDER_SITE_OTHER): Payer: Medicare Other | Admitting: Ophthalmology

## 2014-05-20 DIAGNOSIS — H35342 Macular cyst, hole, or pseudohole, left eye: Secondary | ICD-10-CM

## 2014-07-29 ENCOUNTER — Encounter (INDEPENDENT_AMBULATORY_CARE_PROVIDER_SITE_OTHER): Payer: Medicare Other | Admitting: Ophthalmology

## 2014-07-29 DIAGNOSIS — H2513 Age-related nuclear cataract, bilateral: Secondary | ICD-10-CM | POA: Diagnosis not present

## 2014-07-29 DIAGNOSIS — H43813 Vitreous degeneration, bilateral: Secondary | ICD-10-CM | POA: Diagnosis not present

## 2014-07-29 DIAGNOSIS — H35342 Macular cyst, hole, or pseudohole, left eye: Secondary | ICD-10-CM | POA: Diagnosis not present

## 2014-07-29 DIAGNOSIS — H3531 Nonexudative age-related macular degeneration: Secondary | ICD-10-CM | POA: Diagnosis not present

## 2015-02-02 ENCOUNTER — Encounter (INDEPENDENT_AMBULATORY_CARE_PROVIDER_SITE_OTHER): Payer: Medicare Other | Admitting: Ophthalmology

## 2015-02-02 DIAGNOSIS — H2513 Age-related nuclear cataract, bilateral: Secondary | ICD-10-CM

## 2015-02-02 DIAGNOSIS — H43813 Vitreous degeneration, bilateral: Secondary | ICD-10-CM

## 2015-02-02 DIAGNOSIS — H3531 Nonexudative age-related macular degeneration: Secondary | ICD-10-CM

## 2015-02-02 DIAGNOSIS — H35342 Macular cyst, hole, or pseudohole, left eye: Secondary | ICD-10-CM

## 2015-11-24 ENCOUNTER — Emergency Department: Payer: Medicare Other

## 2015-11-24 ENCOUNTER — Encounter (HOSPITAL_COMMUNITY): Payer: Self-pay | Admitting: Internal Medicine

## 2015-11-24 ENCOUNTER — Emergency Department
Admission: EM | Admit: 2015-11-24 | Discharge: 2015-11-24 | Disposition: A | Discharge status: Other Acute Inpatient Hospital | Payer: Medicare Other | Attending: Emergency Medicine | Admitting: Emergency Medicine

## 2015-11-24 ENCOUNTER — Observation Stay (HOSPITAL_COMMUNITY)
Admission: EM | Admit: 2015-11-24 | Discharge: 2015-11-26 | Disposition: A | Discharge status: Home / Self Care | Payer: Medicare Other | Source: Other Acute Inpatient Hospital | Attending: Internal Medicine | Admitting: Internal Medicine

## 2015-11-24 ENCOUNTER — Encounter: Payer: Self-pay | Admitting: Emergency Medicine

## 2015-11-24 DIAGNOSIS — N400 Enlarged prostate without lower urinary tract symptoms: Secondary | ICD-10-CM | POA: Diagnosis present

## 2015-11-24 DIAGNOSIS — Z79899 Other long term (current) drug therapy: Secondary | ICD-10-CM | POA: Diagnosis not present

## 2015-11-24 DIAGNOSIS — N3281 Overactive bladder: Secondary | ICD-10-CM | POA: Insufficient documentation

## 2015-11-24 DIAGNOSIS — I700 Atherosclerosis of aorta: Secondary | ICD-10-CM | POA: Diagnosis not present

## 2015-11-24 DIAGNOSIS — K921 Melena: Secondary | ICD-10-CM | POA: Insufficient documentation

## 2015-11-24 DIAGNOSIS — C61 Malignant neoplasm of prostate: Secondary | ICD-10-CM | POA: Insufficient documentation

## 2015-11-24 DIAGNOSIS — K625 Hemorrhage of anus and rectum: Secondary | ICD-10-CM | POA: Diagnosis present

## 2015-11-24 DIAGNOSIS — K5790 Diverticulosis of intestine, part unspecified, without perforation or abscess without bleeding: Secondary | ICD-10-CM | POA: Diagnosis present

## 2015-11-24 DIAGNOSIS — L02215 Cutaneous abscess of perineum: Secondary | ICD-10-CM | POA: Diagnosis present

## 2015-11-24 DIAGNOSIS — F17210 Nicotine dependence, cigarettes, uncomplicated: Secondary | ICD-10-CM | POA: Diagnosis not present

## 2015-11-24 DIAGNOSIS — N401 Enlarged prostate with lower urinary tract symptoms: Secondary | ICD-10-CM | POA: Insufficient documentation

## 2015-11-24 DIAGNOSIS — Z86010 Personal history of colonic polyps: Secondary | ICD-10-CM | POA: Insufficient documentation

## 2015-11-24 DIAGNOSIS — K449 Diaphragmatic hernia without obstruction or gangrene: Secondary | ICD-10-CM | POA: Diagnosis not present

## 2015-11-24 DIAGNOSIS — R071 Chest pain on breathing: Secondary | ICD-10-CM | POA: Diagnosis not present

## 2015-11-24 DIAGNOSIS — K922 Gastrointestinal hemorrhage, unspecified: Secondary | ICD-10-CM

## 2015-11-24 DIAGNOSIS — H3530 Unspecified macular degeneration: Secondary | ICD-10-CM | POA: Diagnosis not present

## 2015-11-24 DIAGNOSIS — R079 Chest pain, unspecified: Secondary | ICD-10-CM | POA: Diagnosis present

## 2015-11-24 DIAGNOSIS — K219 Gastro-esophageal reflux disease without esophagitis: Secondary | ICD-10-CM | POA: Diagnosis present

## 2015-11-24 DIAGNOSIS — D62 Acute posthemorrhagic anemia: Secondary | ICD-10-CM | POA: Diagnosis not present

## 2015-11-24 DIAGNOSIS — G4733 Obstructive sleep apnea (adult) (pediatric): Secondary | ICD-10-CM | POA: Diagnosis not present

## 2015-11-24 DIAGNOSIS — K611 Rectal abscess: Secondary | ICD-10-CM | POA: Diagnosis not present

## 2015-11-24 DIAGNOSIS — J439 Emphysema, unspecified: Secondary | ICD-10-CM | POA: Insufficient documentation

## 2015-11-24 DIAGNOSIS — K5730 Diverticulosis of large intestine without perforation or abscess without bleeding: Secondary | ICD-10-CM | POA: Insufficient documentation

## 2015-11-24 DIAGNOSIS — G4730 Sleep apnea, unspecified: Secondary | ICD-10-CM

## 2015-11-24 HISTORY — DX: Tobacco use: Z72.0

## 2015-11-24 HISTORY — DX: Other pneumothorax: J93.83

## 2015-11-24 HISTORY — DX: Macular cyst, hole, or pseudohole, unspecified eye: H35.349

## 2015-11-24 HISTORY — DX: Gastrointestinal hemorrhage, unspecified: K92.2

## 2015-11-24 HISTORY — DX: Diverticulosis of intestine, part unspecified, without perforation or abscess without bleeding: K57.90

## 2015-11-24 LAB — CBC
HCT: 43.7 % (ref 40.0–52.0)
Hemoglobin: 15 g/dL (ref 13.0–18.0)
MCH: 32.2 pg (ref 26.0–34.0)
MCHC: 34.4 g/dL (ref 32.0–36.0)
MCV: 93.7 fL (ref 80.0–100.0)
PLATELETS: 278 10*3/uL (ref 150–440)
RBC: 4.66 MIL/uL (ref 4.40–5.90)
RDW: 13.4 % (ref 11.5–14.5)
WBC: 16.6 10*3/uL — AB (ref 3.8–10.6)

## 2015-11-24 LAB — COMPREHENSIVE METABOLIC PANEL
ALK PHOS: 70 U/L (ref 38–126)
ALT: 41 U/L (ref 17–63)
AST: 26 U/L (ref 15–41)
Albumin: 4.3 g/dL (ref 3.5–5.0)
Anion gap: 10 (ref 5–15)
BUN: 22 mg/dL — AB (ref 6–20)
CALCIUM: 9 mg/dL (ref 8.9–10.3)
CO2: 24 mmol/L (ref 22–32)
CREATININE: 1.07 mg/dL (ref 0.61–1.24)
Chloride: 103 mmol/L (ref 101–111)
Glucose, Bld: 116 mg/dL — ABNORMAL HIGH (ref 65–99)
Potassium: 3.8 mmol/L (ref 3.5–5.1)
Sodium: 137 mmol/L (ref 135–145)
Total Bilirubin: 0.8 mg/dL (ref 0.3–1.2)
Total Protein: 7.8 g/dL (ref 6.5–8.1)

## 2015-11-24 LAB — TROPONIN I: Troponin I: <0.03 ng/mL (ref <0.03)

## 2015-11-24 LAB — TYPE AND SCREEN
ABO/RH(D): A POS
Antibody Screen: NEGATIVE

## 2015-11-24 LAB — PROTIME-INR
INR: 1.02
PROTHROMBIN TIME: 13.6 s (ref 11.4–15.0)

## 2015-11-24 LAB — FIBRIN DERIVATIVES D-DIMER (ARMC ONLY): FIBRIN DERIVATIVES D-DIMER (ARMC): 1842 — AB (ref 0–499)

## 2015-11-24 MED ORDER — OXYCODONE-ACETAMINOPHEN 5-325 MG PO TABS
2.0000 | ORAL_TABLET | Freq: Four times a day (QID) | ORAL | Status: DC | PRN
  Administered 2015-11-25: 2 via ORAL
  Filled 2015-11-24: qty 2

## 2015-11-24 MED ORDER — ACETAMINOPHEN 650 MG RE SUPP: 650.0000 mg | Freq: Four times a day (QID) | RECTAL | Status: DC | PRN

## 2015-11-24 MED ORDER — ONDANSETRON HCL 4 MG/2ML IJ SOLN: 4.0000 mg | Freq: Three times a day (TID) | INTRAMUSCULAR | Status: DC | PRN

## 2015-11-24 MED ORDER — SODIUM CHLORIDE 0.9 % IV BOLUS (SEPSIS)
1000.0000 mL | Freq: Once | INTRAVENOUS | Status: AC
  Administered 2015-11-24: 1000 mL via INTRAVENOUS

## 2015-11-24 MED ORDER — SODIUM CHLORIDE 0.9 % IV SOLN
INTRAVENOUS | Status: DC
  Administered 2015-11-25 (×3): via INTRAVENOUS

## 2015-11-24 MED ORDER — IOPAMIDOL (ISOVUE-370) INJECTION 76%
100.0000 mL | Freq: Once | INTRAVENOUS | Status: AC | PRN
Start: 2015-11-24 — End: 2015-11-24
  Administered 2015-11-24: 100 mL via INTRAVENOUS

## 2015-11-24 MED ORDER — ACETAMINOPHEN 325 MG PO TABS: 650.0000 mg | ORAL_TABLET | Freq: Four times a day (QID) | ORAL | Status: DC | PRN

## 2015-11-24 MED ORDER — DOXYCYCLINE HYCLATE 100 MG PO TABS
100.0000 mg | ORAL_TABLET | Freq: Two times a day (BID) | ORAL | Status: DC
  Administered 2015-11-25 – 2015-11-26 (×4): 100 mg via ORAL
  Filled 2015-11-24 (×4): qty 1

## 2015-11-24 MED ORDER — SODIUM CHLORIDE 0.9% FLUSH
3.0000 mL | Freq: Two times a day (BID) | INTRAVENOUS | Status: DC
  Administered 2015-11-25 – 2015-11-26 (×2): 3 mL via INTRAVENOUS

## 2015-11-24 NOTE — ED Notes (Signed)
Rectal exam completed by MD.

## 2015-11-24 NOTE — ED Notes (Signed)
Pt presents with rectal bleeding x2 today, bright red with some abd cramping. Denies any n/v. Pt with hx of same and was admitted for five days at [facility] one year ago.

## 2015-11-24 NOTE — Progress Notes (Signed)
Received report from [facility] ED RN, Terrence Dupont.

## 2015-11-24 NOTE — H&P (Signed)
History and Physical    Patrick Shepherd [id number] DOB: 1946/02/18 DOA: 11/24/2015  Referring MD/NP/PA:   PCP: No primary care provider on file.   Patient coming from:  The patient is coming from home.  At baseline, pt is independent for most of ADL.       Chief Complaint: Rectal bleeding, chest pain, recurrent perineal abscess  HPI: Patrick Shepherd is a 70 y.o. male with medical history significant of BPH, GERD, OSA, overactive bladder, diverticulosis, GI bleeding, spontaneous pneumothorax, former smoker, macular hole, who presents with rectal bleeding, chest pain and recurrent perineal abscess.  Pt is transferred from [facility] hospital due to GIB and lack of GI coverage.  Patient reports that he has had several episodes of rectal bleeding with bright red colored blood in the past 2 days. He has mild abdominal cramping, but no nausea, vomiting or diarrhea. Pt states that he had similar GIB 2014 and required blood transfusion. He had colonoscopy which was unable to localize source of bleeding.  Pt states that he has chest pain, which has been going on for almost 2 weeks. It is located right lower chest, just above the rib cage. It is moderate, pleuritic, constant, nonradiating. He does not hava fever, chills, cough or shortness of breath.  Pt states that he had hx of perineal abscess and underwent I&D twice before. Now he has recurrent perineal abscess, which is painful. He does not have fever or chills. Patient denies symptoms of UTI, unilateral weakness.  ED Course: pt was found to have hemoglobin 15.0--> 12.6, WBC 16.6, temperature normal, tachycardia, lactate 1.0, INR 1.02, negative troponin, fibrin derivatives elevation 1842, electrolytes and renal function okay. Chest x-ray showed minimal bronchitic change. CT angiogram of the chest is negative for PE, but showed emphysematous change. Patient is placed on telemetry bed for observation. GI was consulted.  Review of Systems:    General: no fevers, chills, no changes in body weight, has fatigue HEENT: no blurry vision, hearing changes or sore throat Pulm: no dyspnea, coughing, wheezing CV: has chest pain, no palpitations Abd: no nausea, vomiting, has abdominal cramping, no diarrhea, constipation GU: no dysuria, burning on urination, increased urinary frequency, hematuria.  Ext: no leg edema Neuro: no unilateral weakness, numbness, or tingling, no vision change or hearing loss Skin: Has perineal abscess. MSK: No muscle spasm, no deformity, no limitation of range of movement in spin Heme: No easy bruising.  Travel history: No recent long distant travel.  Allergy: No Known Allergies  Past Medical History  Diagnosis Date  . BPH (benign prostatic hyperplasia)   . GERD (gastroesophageal reflux disease)   . History of blood transfusion   . Sleep apnea     tested at [facility]  . OAB (overactive bladder)   . Diverticulosis   . GIB (gastrointestinal bleeding)   . Spontaneous pneumothorax   . Tobacco abuse   . Macular hole     Past Surgical History  Procedure Laterality Date  . Colonoscopy    . Collapsed lung      back in 1968  . Cyst excision      from his back x 2  . 25 gauge pars plana vitrectomy with 20 gauge mvr port for macular hole Left 04/21/2014    Procedure: 25 GAUGE PARS PLANA VITRECTOMY WITH 20 GAUGE MVR PORT FOR MACULAR HOLE;  Surgeon: Hayden Pedro, MD;  Location: [place];  Service: Ophthalmology;  Laterality: Left;  . Gas/fluid exchange Left 04/21/2014    Procedure:  GAS/FLUID EXCHANGE;  Surgeon: Hayden Pedro, MD;  Location: [place];  Service: Ophthalmology;  Laterality: Left;  . Serum patch Left 04/21/2014    Procedure: SERUM PATCH;  Surgeon: Hayden Pedro, MD;  Location: [place];  Service: Ophthalmology;  Laterality: Left;  Marland Kitchen Membrane peel Left 04/21/2014    Procedure: MEMBRANE PEEL;  Surgeon: Hayden Pedro, MD;  Location: [place];  Service: Ophthalmology;  Laterality: Left;    Social History:   reports that he has been smoking Cigars.  He does not have any smokeless tobacco history on file. He reports that he drinks about 8.4 oz of alcohol per week. He reports that he does not use illicit drugs.  Family History:  Family History  Problem Relation Age of Onset  . Leukemia Mother   . Coronary artery disease Father   . Peripheral vascular disease Brother      Prior to Admission medications   Medication Sig Start Date End Date Taking? Authorizing Provider  ENABLEX 15 MG 24 hr tablet Take 15 mg by mouth daily. 03/01/14   Historical Provider, MD  esomeprazole (NEXIUM) 20 MG capsule Take 20 mg by mouth 2 (two) times daily before a meal.    Historical Provider, MD  finasteride (PROSCAR) 5 MG tablet Take 5 mg by mouth daily.    Historical Provider, MD  meloxicam (MOBIC) 15 MG tablet Take 15 mg by mouth daily.    Historical Provider, MD  multivitamin-iron-minerals-folic acid (THERAPEUTIC-M) TABS tablet Take 1 tablet by mouth daily.    Historical Provider, MD  tamsulosin (FLOMAX) 0.4 MG CAPS capsule Take 0.4 mg by mouth daily. 03/01/14   Historical Provider, MD    Physical Exam: Filed Vitals:   11/24/15 2241  Height: 6' (1.829 m)  Weight: 92.534 kg (204 lb)   General: Not in acute distress HEENT:       Eyes: PERRL, EOMI, no scleral icterus.       ENT: No discharge from the ears and nose, no pharynx injection, no tonsillar enlargement.        Neck: No JVD, no bruit, no mass felt. Heme: No neck lymph node enlargement. Cardiac: S1/S2, RRR, No murmurs, No gallops or rubs. Pulm: No rales, wheezing, rhonchi or rubs. Abd: Soft, nondistended, nontender, no rebound pain, no organomegaly, BS present. GU: No hematuria Ext: No pitting leg edema bilaterally. 2+DP/PT pulse bilaterally. Musculoskeletal: No joint deformities, No joint redness or warmth, no limitation of ROM in spin. Skin: Has perineal abscess, proximately 2 x 3 cm in size, painful. Neuro: Alert, oriented X3, cranial nerves  II-XII grossly intact, moves all extremities normally.  Psych: Patient is not psychotic, no suicidal or hemocidal ideation.  Labs on Admission: I have personally reviewed following labs and imaging studies  CBC:  Recent Labs Lab 11/24/15 1416 11/25/15 0030  WBC 16.6* 11.6*  HGB 15.0 12.6*  HCT 43.7 37.0*  MCV 93.7 93.9  PLT 278 0000000   Basic Metabolic Panel:  Recent Labs Lab 11/24/15 1416  NA 137  K 3.8  CL 103  CO2 24  GLUCOSE 116*  BUN 22*  CREATININE 1.07  CALCIUM 9.0   GFR: Estimated Creatinine Clearance: 69.5 mL/min (by C-G formula based on Cr of 1.07). Liver Function Tests:  Recent Labs Lab 11/24/15 1416  AST 26  ALT 41  ALKPHOS 70  BILITOT 0.8  PROT 7.8  ALBUMIN 4.3   No results for input(s): LIPASE, AMYLASE in the last 168 hours. No results for input(s): AMMONIA in  the last 168 hours. Coagulation Profile:  Recent Labs Lab 11/24/15 1417  INR 1.02   Cardiac Enzymes:  Recent Labs Lab 11/24/15 1417 11/25/15 0030  TROPONINI <0.03 <0.03   BNP (last 3 results) No results for input(s): PROBNP in the last 8760 hours. HbA1C: No results for input(s): HGBA1C in the last 72 hours. CBG: No results for input(s): GLUCAP in the last 168 hours. Lipid Profile:  Recent Labs  11/25/15 0314  CHOL 176  HDL 28*  LDLCALC 114*  TRIG 171*  CHOLHDL 6.3   Thyroid Function Tests: No results for input(s): TSH, T4TOTAL, FREET4, T3FREE, THYROIDAB in the last 72 hours. Anemia Panel: No results for input(s): VITAMINB12, FOLATE, FERRITIN, TIBC, IRON, RETICCTPCT in the last 72 hours. Urine analysis: No results found for: COLORURINE, APPEARANCEUR, LABSPEC, PHURINE, GLUCOSEU, HGBUR, BILIRUBINUR, KETONESUR, PROTEINUR, UROBILINOGEN, NITRITE, LEUKOCYTESUR Sepsis Labs: @LABRCNTIP (procalcitonin:4,lacticidven:4) )No results found for this or any previous visit (from the past 240 hour(s)).   Radiological Exams on Admission: Ct Angio Chest Pe W/cm &/or Wo  Cm  11/24/2015  CLINICAL DATA:  Chest pain for 3 weeks. EXAM: CT ANGIOGRAPHY CHEST WITH CONTRAST TECHNIQUE: Multidetector CT imaging of the chest was performed using the standard protocol during bolus administration of intravenous contrast. Multiplanar CT image reconstructions and MIPs were obtained to evaluate the vascular anatomy. CONTRAST:  100 mL Isovue 370 COMPARISON:  None. FINDINGS: Mediastinum/Lymph Nodes: No pulmonary emboli or thoracic aortic dissection identified. No masses or pathologically enlarged lymph nodes identified. Mild thoracic aortic atherosclerosis. Normal heart size. No pericardial effusion. Lungs/Pleura: No pulmonary mass, infiltrate, or effusion. Mild bilateral emphysematous changes. Upper abdomen: Moderate size hiatal hernia. Musculoskeletal: No chest wall mass or suspicious bone lesions identified. Review of the MIP images confirms the above findings. IMPRESSION: 1. No evidence of pulmonary embolus. 2.  Emphysema. (ICD10-J43.9) 3.  Aortic Atherosclerosis (ICD10-170.0) Electronically Signed   By: Kathreen Devoid   On: 11/24/2015 18:32   Dg Chest Portable 1 View  11/24/2015  CLINICAL DATA:  RIGHT lower chest pain, smoker EXAM: PORTABLE CHEST 1 VIEW COMPARISON:  Portable exam 1412 hours without priors for comparison FINDINGS: Normal heart size, mediastinal contours, and pulmonary vascularity. Mild peribronchial thickening. Minimal LEFT basilar subsegmental atelectasis. Lungs otherwise clear. No pleural effusion or pneumothorax. No acute osseous findings. IMPRESSION: Minimal bronchitic changes and LEFT basilar atelectasis. Electronically Signed   By: Lavonia Dana M.D.   On: 11/24/2015 15:31     EKG: Independently reviewed. Sinus rhythm, QTC 454, tachycardia.  Assessment/Plan Principal Problem:   Rectal bleeding Active Problems:   BPH (benign prostatic hyperplasia)   GERD (gastroesophageal reflux disease)   Diverticulosis   Chest pain   Perineal abscess   GIB (gastrointestinal  bleeding)   Rectal bleeding: Pt had similar GIB in 2014. He had colonoscopy in [place] on 09/23/12, which did not show any evidence of active bleeding nor was there any intervention performed. It was notable for diverticulosis in decending and sigmoid and rectosigmoid colon, with multiple sessile polyps 2-27mm in size along ascending, transverse colon and cecum which were not removed. The polyps seems be removed on repeated colonoscopy on 03/2013. Mobic is on his med list, which may have contributed to his rectal bleeding. Hgb 15.0-->12.6. GI, Dr. Paulita Fujita was consulted by EDP, will see in AM.  --will place on tele bed for obs - GI consulted by Ed, will follow up recommendations - NPO - IVF: 1L NS and then 100 mL/hr - Start IV pantoprazole 40 mg bib - Zofran IV  for nausea - hold Mobic - Maintain IV access (2 large bore IVs if possible). - Monitor closely and follow q6h cbc, transfuse as necessary. - LaB: INR, PTt and type screen  BPH: stable - Continue Flomax and Proscar  Overactive bladder:  -continue Enablex  GERD (gastroesophageal reflux disease): -On IV protonix  Chest pain: Etiology is not clear. Initial Trop x negative. His chest pain is pleuritic, dose not seem to be cardiac etiology.  EKG has no ischemic change. Chest x-ray showed minimal bronchitis change. CT angiogram of chest is negative for PE, but showed emphysematous change. Patient does not have SOB, but has oxygen desaturation to 92% on room air, indicating possible bronchitis. -prn percocet for pain -trop x 3 -started oral doxycycline which is mainly for Perineal abscess, should cover possible bronchitis.  Perineal Abscess: pt has recurrent perineal abscess. He has leukocytosis, but not septic. lactate is normal. Hemodynamically stable. -Start oral doxycycline -f/u Blood culture -Please call surgeon in AM for I&D.  DVT ppx: SCD Code Status: Full code Family Communication: None at bed side.  Disposition Plan:   Anticipate discharge back to previous home environment Consults called:  GI, dr. Paulita Fujita was consulted Admission status: Obs / tele   Date of Service 11/25/2015    Ivor Costa [facility] Pager [phone]  If 7PM-7AM, please contact night-coverage [email or web address] Password [facility] 11/25/2015, 4:45 AM

## 2015-11-24 NOTE — ED Notes (Signed)
2 RN's have attempted IV access without success. 4 attempts made. Charge nurse made aware of need of IV ultrasound.

## 2015-11-24 NOTE — ED Notes (Signed)
[facility] here to transport patient. 

## 2015-11-24 NOTE — Progress Notes (Signed)
NURSING PROGRESS NOTE  Patrick Shepherd [id number] Admission Data: 11/24/2015 11:55 PM Attending Provider: Ivor Costa, MD PCP:No primary care provider on file. Code Status: FULL  Allergies:  Review of patient's allergies indicates no known allergies. Past Medical History:   has a past medical history of BPH (benign prostatic hyperplasia); GERD (gastroesophageal reflux disease); History of blood transfusion; Sleep apnea; OAB (overactive bladder); Diverticulosis; GIB (gastrointestinal bleeding); Spontaneous pneumothorax; Tobacco abuse; and Macular hole. Past Surgical History:   has past surgical history that includes Colonoscopy; collapsed lung; Cyst excision; 25 gauge pars plana vitrectomy with 20 gauge mvr port for macular hole (Left, 04/21/2014); Gas/fluid exchange (Left, 04/21/2014); Serum patch (Left, 04/21/2014); and Membrane peel (Left, 04/21/2014). Social History:   reports that he has been smoking Cigars.  He does not have any smokeless tobacco history on file. He reports that he drinks about 8.4 oz of alcohol per week. He reports that he does not use illicit drugs.  Patrick Shepherd is a 70 y.o. male patient admitted from ED:   Last Documented Vital Signs: Weight 92.534 kg (204 lb).   IV Fluids:  IV in place, occlusive dsg intact without redness, IV cath antecubital right, condition patent and no redness none.   Skin: Appropriate for ethnicity and intact with the exception of a small scab on the left hand knuckle.   Patient orientated to room. Information packet given to patient. Admission inpatient armband information verified with patient to include name and date of birth and placed on patient arm. Side rails up x 2, fall assessment and education completed with patient. Patient able to verbalize understanding of risk associated with falls and verbalized understanding to call for assistance before getting out of bed. Call light within reach. Patient able to voice and demonstrate  understanding of unit orientation instructions.    Will continue to evaluate and treat per MD orders.  Clydell Hakim RN, BSN

## 2015-11-24 NOTE — ED Notes (Signed)
Patient presents to ED with c/o bright red rectal bleeding that began 2 hours ago. Patient states this happened before about 2-3 years ago. Patient spent 5 days at [facility] and states, "they weren't able to find out where the bleeding was coming from". Patient denies pain. States he was at his PCP for chest pain. There EKG and chest xray was unremarkable. Patient states tomorrow he is suppose to have surgery to get a peritoneal abscess removed. Patient is A&O x4. Denies pain at this time.

## 2015-11-24 NOTE — Progress Notes (Addendum)
This is a no charge note  Transfer from [place] per Dr. Alfred Levins.  70 year old man with past medical history of GI bleeding from diverticulosis, requested transfusion in [facility] 2014, GERD, BPH, OSA, remote hx of spontaneous pneumothorax, tobacco abuse, former alcoholism, who presents with rectal bleeding, also with right lower chest pain.  Patient has been having rectal bleeding with bright red blood for 2 days. he passed out 2 cups of blood clot. Has abdominal cramping, but no nausea, vomiting. Initially tachycardia with heart rates 128 which responded to IV fluid. Currently hemodynamically stable. Hemoglobin 15. Patient also has pleuritic right lower chest pain for 2 weeks. CTA chest is negative for PE. Initial troponin negative, EKG has no ischemic change. Patient has a recurrent peroneal abscess, and is scheduled to have surgeon follow up tomorrow. No abdominal tenderness on exam per EDP. Due to lack of GI coverage, pt is transferred to [facility]. GI, Dr. Paulita Fujita was consulted, will see pt in AM. Pt is accepted to tele bed for obs.  Ivor Costa, MD  [facility] Pager [phone]  If 7PM-7AM, please contact night-coverage [email or web address] Password [facility] 11/24/2015, 8:49 PM

## 2015-11-24 NOTE — ED Notes (Signed)
CT called to check patients status in their line up. CT states they are busy and will get to him as soon as possible. Patient made aware.

## 2015-11-24 NOTE — ED Provider Notes (Signed)
[facility] Emergency Department Provider Note  ____________________________________________  Time seen: Approximately 3:23 PM  I have reviewed the triage vital signs and the nursing notes.   HISTORY  Chief Complaint Rectal Bleeding   HPI Patrick Shepherd is a 70 y.o. male with a history of active prostate cancer, BPH, diverticulosis status post lower GI bleeding requiring blood transfusion in 2014, and spontaneous pneumothorax who presents for evaluation of rectal bleeding. Patient reports this started today. Has had several episodes of bright red blood per rectum. He reports that he believes he lost more than a cup of blood so far. Has had a prior episode 3 years ago at [facility] requiring MICU admission and blood transfusion. Colonoscopy at that time and was unable to localize active bleeding but did diagnose patient with diverticulosis. Patient reports he has never had an endoscopy and no history of ulcers however he has been taking Aleve for the last week for chest pain. Patient denies shortness of breath, dizziness, syncope, abdominal pain. He also reports that for the last 3 weeks he has had right lower chest pain that is worse with inspiration, when he laughs or sneezes. He denies any diaphoresis, nausea, vomiting, fever, cough. He reports his never had similar pain before. He does have a history of spontaneous pneumothorax 50 years ago. Patient denies history of ischemic heart disease, personal or family history blood clots, recent travel immobilization, leg pain or swelling. He currently endorses no chest pain while laying in bed however if he takes a deep breath or moves around he points to the right lower side of his chest as a sharp nonradiating pain. He describes the pain as moderate.  Past Medical History  Diagnosis Date  . BPH (benign prostatic hyperplasia)   . GERD (gastroesophageal reflux disease)   . History of blood transfusion   . Sleep apnea    tested at [facility]  . OAB (overactive bladder)     Patient Active Problem List   Diagnosis Date Noted  . Macular hole, left eye 04/21/2014  . 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 Colonoscopy    . Collapsed lung      back in 1968  . Cyst excision      from his back x 2  . 25 gauge pars plana vitrectomy with 20 gauge mvr port for macular hole Left 04/21/2014    Procedure: 25 GAUGE PARS PLANA VITRECTOMY WITH 20 GAUGE MVR PORT FOR MACULAR HOLE;  Surgeon: Hayden Pedro, MD;  Location: [place];  Service: Ophthalmology;  Laterality: Left;  . Gas/fluid exchange Left 04/21/2014    Procedure: GAS/FLUID EXCHANGE;  Surgeon: Hayden Pedro, MD;  Location: [place];  Service: Ophthalmology;  Laterality: Left;  . Serum patch Left 04/21/2014    Procedure: SERUM PATCH;  Surgeon: Hayden Pedro, MD;  Location: [place];  Service: Ophthalmology;  Laterality: Left;  Marland Kitchen Membrane peel Left 04/21/2014    Procedure: MEMBRANE PEEL;  Surgeon: Hayden Pedro, MD;  Location: [place];  Service: Ophthalmology;  Laterality: Left;    Current Outpatient Rx  Name  Route  Sig  Dispense  Refill  . ENABLEX 15 MG 24 hr tablet   Oral   Take 15 mg by mouth daily.         Marland Kitchen esomeprazole (NEXIUM) 20 MG capsule   Oral   Take 20 mg by mouth 2 (two)  times daily before a meal.         . finasteride (PROSCAR) 5 MG tablet   Oral   Take 5 mg by mouth daily.         . meloxicam (MOBIC) 15 MG tablet   Oral   Take 15 mg by mouth daily.         . multivitamin-iron-minerals-folic acid (THERAPEUTIC-M) TABS tablet   Oral   Take 1 tablet by mouth daily.         . tamsulosin (FLOMAX) 0.4 MG CAPS capsule   Oral   Take 0.4 mg by mouth daily.           Allergies Review of patient's allergies indicates no known allergies.  No family history on file.  Social History Social History  Substance Use Topics  . Smoking status: Current Every Day  Smoker    Types: Cigars  . Smokeless tobacco: None     Comment: smokes 2 a day  . Alcohol Use: 8.4 oz/week    14 Shots of liquor per week    Review of Systems  Constitutional: Negative for fever. Eyes: Negative for visual changes. ENT: Negative for sore throat. Cardiovascular: + chest pain. Respiratory: Negative for shortness of breath. Gastrointestinal: Negative for abdominal pain, vomiting or diarrhea. + BRBPR Genitourinary: Negative for dysuria. Musculoskeletal: Negative for back pain. Skin: Negative for rash. Neurological: Negative for headaches, weakness or numbness.  ____________________________________________   PHYSICAL EXAM:  VITAL SIGNS: ED Triage Vitals  Enc Vitals Group     BP 11/24/15 1413 121/76 mmHg     Pulse Rate 11/24/15 1413 128     Resp 11/24/15 1413 20     Temp --      Temp src --      SpO2 11/24/15 1413 96 %     Weight 11/24/15 1413 209 lb (94.802 kg)     Height 11/24/15 1413 6' (1.829 m)     Head Cir --      Peak Flow --      Pain Score --      Pain Loc --      Pain Edu? --      Excl. in [place]? --     Constitutional: Alert and oriented. Well appearing and in no apparent distress. HEENT:      Head: Normocephalic and atraumatic.         Eyes: Conjunctivae are normal. Sclera is non-icteric. EOMI. PERRL      Mouth/Throat: Mucous membranes are moist.       Neck: Supple with no signs of meningismus. Cardiovascular: Tachycardic with regular rhythm. No murmurs, gallops, or rubs. 2+ symmetrical distal pulses are present in all extremities. No JVD. Respiratory: Normal respiratory effort. Decreased air movement on the right. No wheezes, crackles, or rhonchi.  Gastrointestinal: Soft, non tender, and non distended with positive bowel sounds. No rebound or guarding. rectal exam showing bright red blood and Hemoccult positive.  Genitourinary: No suprapubic tenderness. No CVA tenderness. Musculoskeletal: Nontender with normal range of motion in all  extremities. No edema, cyanosis, or erythema of extremities. Neurologic: Normal speech and language. Face is symmetric. Moving all extremities. No gross focal neurologic deficits are appreciated. Skin: Skin is warm, dry and intact. No rash noted. Psychiatric: Mood and affect are normal. Speech and behavior are normal.  ____________________________________________   LABS (all labs ordered are listed, but only abnormal results are displayed)  Labs Reviewed  COMPREHENSIVE METABOLIC PANEL - Abnormal; Notable for the following:  Glucose, Bld 116 (*)    BUN 22 (*)    All other components within normal limits  CBC - Abnormal; Notable for the following:    WBC 16.6 (*)    All other components within normal limits  FIBRIN DERIVATIVES D-DIMER ([facility] ONLY) - Abnormal; Notable for the following:    Fibrin derivatives D-dimer ([facility]) 1842 (*)    All other components within normal limits  PROTIME-INR  TROPONIN I  POC OCCULT BLOOD, ED  TYPE AND SCREEN   ____________________________________________  EKG  ED ECG REPORT I, Rudene Re, the attending physician, personally viewed and interpreted this ECG.  Sinus tachycardia with rare PVCs, rate of 110, normal intervals, normal axis, no ST elevations or depressions. EKG unchanged from prior from 2015. ____________________________________________  RADIOLOGY  CTA chest: Negative ____________________________________________   PROCEDURES  Procedure(s) performed: None Critical Care performed:  None ____________________________________________   INITIAL IMPRESSION / ASSESSMENT AND PLAN / ED COURSE   70 y.o. male with a history of active prostate cancer, BPH, diverticulosis status post lower GI bleeding requiring blood transfusion, and spontaneous pneumothorax and presents for evaluation of rectal bleeding. Patient with prior history of rectal bleeding requiring transfusion a few years back. He is hemodynamically stable, bright red blood  per rectum on rectal exam, abdomen is soft. Differential diagnoses including diverticular bleed versus possible peptic ulcer disease as patient has been taking Aleve for the last week due to his chest pain. Plan for 2 large-bore IVs, type and screen, labs, and admission For GI evaluation.  Patient also complaining of right lower chest pain pleuritic in nature for the last 3 weeks. Patient has active prostate cancer. No other risk factors for PE. We'll get a chest x-ray to rule out another spontaneous pneumothorax, which check a d-dimer. EKG is nonischemic. We'll check troponin spell last likely ACS based on the description of the pain.  ----------------------------------------- 6:51 PM on 11/24/2015 -----------------------------------------  Patient remains hemodynamically stable. CT of the chest to note this of pulmonary embolism. Labs show a stable hemoglobin at 15.0 however due to prior history of severe diverticular bleed requiring NICU admission and transfusion I feel the patient warrants admission for observation and GI evaluation. Unfortunately we do not have GI available here today and tomorrow. Patient is followed at [facility]. We'll attempt to transfer patient to [facility] for GI.  Pertinent labs & imaging results that were available during my care of the patient were reviewed by me and considered in my medical decision making (see chart for details).    ____________________________________________   FINAL CLINICAL IMPRESSION(S) / ED DIAGNOSES  Final diagnoses:  Acute lower GI bleeding  Chest pain on breathing      NEW MEDICATIONS STARTED DURING THIS VISIT:  New Prescriptions   No medications on file     Note:  This document was prepared using Dragon voice recognition software and may include unintentional dictation errors.     Rudene Re, MD 11/24/15 [phone]

## 2015-11-24 NOTE — ED Notes (Signed)
Patient transported to CT 

## 2015-11-25 ENCOUNTER — Encounter (HOSPITAL_COMMUNITY): Payer: Self-pay | Admitting: Internal Medicine

## 2015-11-25 DIAGNOSIS — K219 Gastro-esophageal reflux disease without esophagitis: Secondary | ICD-10-CM

## 2015-11-25 DIAGNOSIS — K625 Hemorrhage of anus and rectum: Secondary | ICD-10-CM | POA: Diagnosis not present

## 2015-11-25 DIAGNOSIS — R079 Chest pain, unspecified: Secondary | ICD-10-CM

## 2015-11-25 DIAGNOSIS — N400 Enlarged prostate without lower urinary tract symptoms: Secondary | ICD-10-CM | POA: Diagnosis not present

## 2015-11-25 DIAGNOSIS — K611 Rectal abscess: Secondary | ICD-10-CM | POA: Diagnosis not present

## 2015-11-25 DIAGNOSIS — L02215 Cutaneous abscess of perineum: Secondary | ICD-10-CM

## 2015-11-25 LAB — PROCALCITONIN

## 2015-11-25 LAB — COMPREHENSIVE METABOLIC PANEL
ALBUMIN: 3 g/dL — AB (ref 3.5–5.0)
ALT: 30 U/L (ref 17–63)
ANION GAP: 6 (ref 5–15)
AST: 19 U/L (ref 15–41)
Alkaline Phosphatase: 52 U/L (ref 38–126)
BILIRUBIN TOTAL: 0.7 mg/dL (ref 0.3–1.2)
BUN: 15 mg/dL (ref 6–20)
CO2: 25 mmol/L (ref 22–32)
Calcium: 8.4 mg/dL — ABNORMAL LOW (ref 8.9–10.3)
Chloride: 108 mmol/L (ref 101–111)
Creatinine, Ser: 0.93 mg/dL (ref 0.61–1.24)
GFR calc Af Amer: >60 mL/min (ref >60)
GFR calc non Af Amer: >60 mL/min (ref >60)
GLUCOSE: 116 mg/dL — AB (ref 65–99)
POTASSIUM: 4.2 mmol/L (ref 3.5–5.1)
SODIUM: 139 mmol/L (ref 135–145)
TOTAL PROTEIN: 6.1 g/dL — AB (ref 6.5–8.1)

## 2015-11-25 LAB — LIPID PANEL
CHOL/HDL RATIO: 6.3 ratio
Cholesterol: 176 mg/dL (ref 0–200)
HDL: 28 mg/dL — ABNORMAL LOW (ref >40)
LDL Cholesterol: 114 mg/dL — ABNORMAL HIGH (ref 0–99)
Triglycerides: 171 mg/dL — ABNORMAL HIGH (ref <150)
VLDL: 34 mg/dL (ref 0–40)

## 2015-11-25 LAB — CBC
HEMATOCRIT: 35.2 % — AB (ref 39.0–52.0)
HEMATOCRIT: 35.7 % — AB (ref 39.0–52.0)
HEMATOCRIT: 37 % — AB (ref 39.0–52.0)
HEMOGLOBIN: 12.1 g/dL — AB (ref 13.0–17.0)
HEMOGLOBIN: 12.6 g/dL — AB (ref 13.0–17.0)
Hemoglobin: 11.7 g/dL — ABNORMAL LOW (ref 13.0–17.0)
MCH: 31.6 pg (ref 26.0–34.0)
MCH: 32 pg (ref 26.0–34.0)
MCH: 32 pg (ref 26.0–34.0)
MCHC: 33.2 g/dL (ref 30.0–36.0)
MCHC: 33.9 g/dL (ref 30.0–36.0)
MCHC: 34.1 g/dL (ref 30.0–36.0)
MCV: 93.9 fL (ref 78.0–100.0)
MCV: 94.4 fL (ref 78.0–100.0)
MCV: 95.1 fL (ref 78.0–100.0)
PLATELETS: 234 10*3/uL (ref 150–400)
Platelets: 234 10*3/uL (ref 150–400)
Platelets: 246 10*3/uL (ref 150–400)
RBC: 3.7 MIL/uL — ABNORMAL LOW (ref 4.22–5.81)
RBC: 3.78 MIL/uL — AB (ref 4.22–5.81)
RBC: 3.94 MIL/uL — AB (ref 4.22–5.81)
RDW: 13.1 % (ref 11.5–15.5)
RDW: 13.2 % (ref 11.5–15.5)
RDW: 13.3 % (ref 11.5–15.5)
WBC: 10.8 10*3/uL — ABNORMAL HIGH (ref 4.0–10.5)
WBC: 11.6 10*3/uL — AB (ref 4.0–10.5)
WBC: 8.3 10*3/uL (ref 4.0–10.5)

## 2015-11-25 LAB — TYPE AND SCREEN
ABO/RH(D): A POS
Antibody Screen: NEGATIVE

## 2015-11-25 LAB — HEMOGLOBIN A1C
HEMOGLOBIN A1C: 5.3 % (ref 4.8–5.6)
MEAN PLASMA GLUCOSE: 105 mg/dL

## 2015-11-25 LAB — APTT: aPTT: 32 seconds (ref 24–37)

## 2015-11-25 LAB — ABO/RH: ABO/RH(D): A POS

## 2015-11-25 LAB — LACTIC ACID, PLASMA
LACTIC ACID, VENOUS: 1 mmol/L (ref 0.5–1.9)
LACTIC ACID, VENOUS: 1 mmol/L (ref 0.5–1.9)

## 2015-11-25 LAB — TROPONIN I: Troponin I: <0.03 ng/mL (ref <0.03)

## 2015-11-25 LAB — GLUCOSE, CAPILLARY: Glucose-Capillary: 103 mg/dL — ABNORMAL HIGH (ref 65–99)

## 2015-11-25 MED ORDER — PANTOPRAZOLE SODIUM 40 MG IV SOLR
40.0000 mg | Freq: Two times a day (BID) | INTRAVENOUS | Status: DC
  Administered 2015-11-25 – 2015-11-26 (×3): 40 mg via INTRAVENOUS
  Filled 2015-11-25 (×4): qty 40

## 2015-11-25 MED ORDER — TAMSULOSIN HCL 0.4 MG PO CAPS
0.4000 mg | ORAL_CAPSULE | Freq: Every day | ORAL | Status: DC
  Administered 2015-11-25 – 2015-11-26 (×2): 0.4 mg via ORAL
  Filled 2015-11-25 (×2): qty 1

## 2015-11-25 MED ORDER — DARIFENACIN HYDROBROMIDE ER 15 MG PO TB24
15.0000 mg | ORAL_TABLET | Freq: Every day | ORAL | Status: DC
  Administered 2015-11-25 – 2015-11-26 (×2): 15 mg via ORAL
  Filled 2015-11-25 (×2): qty 1

## 2015-11-25 MED ORDER — ADULT MULTIVITAMIN W/MINERALS CH
1.0000 | ORAL_TABLET | Freq: Every day | ORAL | Status: DC
  Administered 2015-11-25 – 2015-11-26 (×2): 1 via ORAL
  Filled 2015-11-25 (×2): qty 1

## 2015-11-25 MED ORDER — LIDOCAINE-EPINEPHRINE 1 %-1:100000 IJ SOLN
10.0000 mL | Freq: Once | INTRAMUSCULAR | Status: AC
  Administered 2015-11-25: 10 mL via INTRADERMAL
  Filled 2015-11-25: qty 10

## 2015-11-25 MED ORDER — FINASTERIDE 5 MG PO TABS
5.0000 mg | ORAL_TABLET | Freq: Every day | ORAL | Status: DC
  Administered 2015-11-25 – 2015-11-26 (×2): 5 mg via ORAL
  Filled 2015-11-25 (×2): qty 1

## 2015-11-25 MED ORDER — TRAMADOL HCL 50 MG PO TABS
100.0000 mg | ORAL_TABLET | Freq: Three times a day (TID) | ORAL | Status: DC
  Administered 2015-11-25 – 2015-11-26 (×3): 100 mg via ORAL
  Filled 2015-11-25 (×3): qty 2

## 2015-11-25 NOTE — Consult Note (Signed)
[facility] Surgery Consult Note  Patrick Shepherd [birth year]  [id number].    Requesting MD: Dr. Irine Seal Chief Complaint/Reason for Consult: perineal abscess, recurrent HPI:  70 y.o. Male with PMH of BPH, GERD, OSA, diverticulosis, macular degeneration, and history of tobacco use who was transferred to [facility] from [place] yesterday with acute onset of hematochezia and abdominal cramping. Reports this happened before, about 3 years ago, when he was treated at [facility] and no source of bleeding was identified. He was found to have recurrence of perineal abscess during admission and general surgery was asked to consult. Patient reports a PMH of perineal abscess twice before this admission requiring I&D (07/2015, 09/2015). In both cases the abscesses were drained and left open without packing. Patient has seen a surgeon in consult after his PCP was concerned about possible fistula formation but has received no further surgical management.     Patient denies fever, chills, HA, SOB, nausea, vomiting, diarrhea, dysuria, hematuria, and weakness. Reports continued chest pain that is worse with movement/coughing, troponins were negative.   Has NKDA Denies use of blood thinning medications.  ROS: All systems reviewed and otherwise negative except for as above  Family History  Problem Relation Age of Onset  . Leukemia Mother   . Coronary artery disease Father   . Peripheral vascular disease Brother     Past Medical History  Diagnosis Date  . BPH (benign prostatic hyperplasia)   . GERD (gastroesophageal reflux disease)   . History of blood transfusion   . Sleep apnea     tested at [facility]  . OAB (overactive bladder)   . Diverticulosis   . GIB (gastrointestinal bleeding)   . Spontaneous pneumothorax   . Tobacco abuse   . Macular hole     Past Surgical History  Procedure Laterality Date  . Colonoscopy    . Collapsed lung      back in 1968  . Cyst excision      from his back x 2   . 25 gauge pars plana vitrectomy with 20 gauge mvr port for macular hole Left 04/21/2014    Procedure: 25 GAUGE PARS PLANA VITRECTOMY WITH 20 GAUGE MVR PORT FOR MACULAR HOLE;  Surgeon: Hayden Pedro, MD;  Location: [place];  Service: Ophthalmology;  Laterality: Left;  . Gas/fluid exchange Left 04/21/2014    Procedure: GAS/FLUID EXCHANGE;  Surgeon: Hayden Pedro, MD;  Location: [place];  Service: Ophthalmology;  Laterality: Left;  . Serum patch Left 04/21/2014    Procedure: SERUM PATCH;  Surgeon: Hayden Pedro, MD;  Location: [place];  Service: Ophthalmology;  Laterality: Left;  Marland Kitchen Membrane peel Left 04/21/2014    Procedure: MEMBRANE PEEL;  Surgeon: Hayden Pedro, MD;  Location: [place];  Service: Ophthalmology;  Laterality: Left;    Social History:  reports that he has been smoking Cigars.  He does not have any smokeless tobacco history on file. He reports that he drinks about 8.4 oz of alcohol per week. He reports that he does not use illicit drugs.  Allergies: No Known Allergies  Medications Prior to Admission  Medication Sig Dispense Refill  . ENABLEX 15 MG 24 hr tablet Take 15 mg by mouth daily.    Marland Kitchen esomeprazole (NEXIUM) 20 MG capsule Take 20 mg by mouth 2 (two) times daily before a meal.    . finasteride (PROSCAR) 5 MG tablet Take 5 mg by mouth daily.    . meloxicam (MOBIC) 15 MG tablet Take 15 mg  by mouth daily.    . multivitamin-iron-minerals-folic acid (THERAPEUTIC-M) TABS tablet Take 1 tablet by mouth daily.    . tamsulosin (FLOMAX) 0.4 MG CAPS capsule Take 0.4 mg by mouth daily.      Blood pressure 109/54, pulse 75, temperature 97.8 F (36.6 C), resp. rate 18, height 6' (1.829 m), weight 92.534 kg (204 lb), SpO2 94 %. Physical Exam: General: pleasant, WD/WN white male who is laying in bed in NAD HEENT: head is normocephalic, atraumatic.  Sclera are noninjected. Heart: regular, rate, and rhythm.  No obvious murmurs, gallops, or rubs noted.  Palpable pedal pulses  bilaterally Lungs: CTAB, no wheezes, rhonchi, or rales noted.  Respiratory effort nonlabored Abd: soft, NT/ND, +BS MS: all 4 extremities are symmetrical with no cyanosis, clubbing, or edema. Skin: warm and dry with no masses. Perineal abscess between rectum and scrotum, not actively draining, 2-4 cm of fluctuance, mild induration tracking towards scrotum. No scrotal or penile swelling.  Psych: A&Ox3 with an appropriate affect. Neuro: CM 2-12 intact, extremity CSM intact bilaterally, normal speech  Results for orders placed or performed during the hospital encounter of 11/24/15 (from the past 48 hour(s))  Type and screen [place]     Status: None   Collection Time: 11/25/15 12:15 AM  Result Value Ref Range   ABO/RH(D) A POS    Antibody Screen NEG    Sample Expiration 11/28/2015   ABO/Rh     Status: None   Collection Time: 11/25/15 12:15 AM  Result Value Ref Range   ABO/RH(D) A POS   Lactic acid, plasma     Status: None   Collection Time: 11/25/15 12:29 AM  Result Value Ref Range   Lactic Acid, Venous 1.0 0.5 - 1.9 mmol/L  CBC     Status: Abnormal   Collection Time: 11/25/15 12:30 AM  Result Value Ref Range   WBC 11.6 (H) 4.0 - 10.5 K/uL   RBC 3.94 (L) 4.22 - 5.81 MIL/uL   Hemoglobin 12.6 (L) 13.0 - 17.0 g/dL   HCT 37.0 (L) 39.0 - 52.0 %   MCV 93.9 78.0 - 100.0 fL   MCH 32.0 26.0 - 34.0 pg   MCHC 34.1 30.0 - 36.0 g/dL   RDW 13.1 11.5 - 15.5 %   Platelets 246 150 - 400 K/uL  APTT     Status: None   Collection Time: 11/25/15 12:30 AM  Result Value Ref Range   aPTT 32 24 - 37 seconds  Troponin I (q 6hr x 3)     Status: None   Collection Time: 11/25/15 12:30 AM  Result Value Ref Range   Troponin I <0.03 <0.03 ng/mL  Procalcitonin     Status: None   Collection Time: 11/25/15 12:30 AM  Result Value Ref Range   Procalcitonin <0.10 ng/mL    Comment:        Interpretation: PCT (Procalcitonin) <= 0.5 ng/mL: Systemic infection (sepsis) is not likely. Local  bacterial infection is possible. (NOTE)         ICU PCT Algorithm               Non ICU PCT Algorithm    ----------------------------     ------------------------------         PCT < 0.25 ng/mL                 PCT < 0.1 ng/mL     Stopping of antibiotics  Stopping of antibiotics       strongly encouraged.               strongly encouraged.    ----------------------------     ------------------------------       PCT level decrease by               PCT < 0.25 ng/mL       >= 80% from peak PCT       OR PCT 0.25 - 0.5 ng/mL          Stopping of antibiotics                                             encouraged.     Stopping of antibiotics           encouraged.    ----------------------------     ------------------------------       PCT level decrease by              PCT >= 0.25 ng/mL       < 80% from peak PCT        AND PCT >= 0.5 ng/mL            Continuin g antibiotics                                              encouraged.       Continuing antibiotics            encouraged.    ----------------------------     ------------------------------     PCT level increase compared          PCT > 0.5 ng/mL         with peak PCT AND          PCT >= 0.5 ng/mL             Escalation of antibiotics                                          strongly encouraged.      Escalation of antibiotics        strongly encouraged.   Lipid panel     Status: Abnormal   Collection Time: 11/25/15  3:14 AM  Result Value Ref Range   Cholesterol 176 0 - 200 mg/dL   Triglycerides 171 (H) <150 mg/dL   HDL 28 (L) >40 mg/dL   Total CHOL/HDL Ratio 6.3 RATIO   VLDL 34 0 - 40 mg/dL   LDL Cholesterol 114 (H) 0 - 99 mg/dL    Comment:        Total Cholesterol/HDL:CHD Risk Coronary Heart Disease Risk Table                     Men   Women  1/2 Average Risk   3.4   3.3  Average Risk       5.0   4.4  2 X Average Risk   9.6   7.1  3 X Average Risk  23.4   11.0        Use the calculated Patient Ratio  above and  the CHD Risk Table to determine the patient's CHD Risk.        ATP III CLASSIFICATION (LDL):  <100     mg/dL   Optimal  100-129  mg/dL   Near or Above                    Optimal  130-159  mg/dL   Borderline  160-189  mg/dL   High  >190     mg/dL   Very High   Lactic acid, plasma     Status: None   Collection Time: 11/25/15  3:15 AM  Result Value Ref Range   Lactic Acid, Venous 1.0 0.5 - 1.9 mmol/L  CBC     Status: Abnormal   Collection Time: 11/25/15  5:38 AM  Result Value Ref Range   WBC 10.8 (H) 4.0 - 10.5 K/uL   RBC 3.78 (L) 4.22 - 5.81 MIL/uL   Hemoglobin 12.1 (L) 13.0 - 17.0 g/dL   HCT 35.7 (L) 39.0 - 52.0 %   MCV 94.4 78.0 - 100.0 fL   MCH 32.0 26.0 - 34.0 pg   MCHC 33.9 30.0 - 36.0 g/dL   RDW 13.2 11.5 - 15.5 %   Platelets 234 150 - 400 K/uL  Troponin I (q 6hr x 3)     Status: None   Collection Time: 11/25/15  5:38 AM  Result Value Ref Range   Troponin I <0.03 <0.03 ng/mL  Comprehensive metabolic panel     Status: Abnormal   Collection Time: 11/25/15  5:38 AM  Result Value Ref Range   Sodium 139 135 - 145 mmol/L   Potassium 4.2 3.5 - 5.1 mmol/L   Chloride 108 101 - 111 mmol/L   CO2 25 22 - 32 mmol/L   Glucose, Bld 116 (H) 65 - 99 mg/dL   BUN 15 6 - 20 mg/dL   Creatinine, Ser 0.93 0.61 - 1.24 mg/dL   Calcium 8.4 (L) 8.9 - 10.3 mg/dL   Total Protein 6.1 (L) 6.5 - 8.1 g/dL   Albumin 3.0 (L) 3.5 - 5.0 g/dL   AST 19 15 - 41 U/L   ALT 30 17 - 63 U/L   Alkaline Phosphatase 52 38 - 126 U/L   Total Bilirubin 0.7 0.3 - 1.2 mg/dL   GFR calc non Af Amer >60 >60 mL/min   GFR calc Af Amer >60 >60 mL/min    Comment: (NOTE) The eGFR has been calculated using the CKD EPI equation. This calculation has not been validated in all clinical situations. eGFR's persistently <60 mL/min signify possible Chronic Kidney Disease.    Anion gap 6 5 - 15  Glucose, capillary     Status: Abnormal   Collection Time: 11/25/15  7:50 AM  Result Value Ref Range   Glucose-Capillary 103 (H)  65 - 99 mg/dL   Ct Angio Chest Pe W/cm &/or Wo Cm  11/24/2015  CLINICAL DATA:  Chest pain for 3 weeks. EXAM: CT ANGIOGRAPHY CHEST WITH CONTRAST TECHNIQUE: Multidetector CT imaging of the chest was performed using the standard protocol during bolus administration of intravenous contrast. Multiplanar CT image reconstructions and MIPs were obtained to evaluate the vascular anatomy. CONTRAST:  100 mL Isovue 370 COMPARISON:  None. FINDINGS: Mediastinum/Lymph Nodes: No pulmonary emboli or thoracic aortic dissection identified. No masses or pathologically enlarged lymph nodes identified. Mild thoracic aortic atherosclerosis. Normal heart size. No pericardial effusion. Lungs/Pleura: No pulmonary mass, infiltrate, or effusion. Mild bilateral emphysematous changes. Upper abdomen:  Moderate size hiatal hernia. Musculoskeletal: No chest wall mass or suspicious bone lesions identified. Review of the MIP images confirms the above findings. IMPRESSION: 1. No evidence of pulmonary embolus. 2.  Emphysema. (ICD10-J43.9) 3.  Aortic Atherosclerosis (ICD10-170.0) Electronically Signed   By: Kathreen Devoid   On: 11/24/2015 18:32   Dg Chest Portable 1 View  11/24/2015  CLINICAL DATA:  RIGHT lower chest pain, smoker EXAM: PORTABLE CHEST 1 VIEW COMPARISON:  Portable exam 1412 hours without priors for comparison FINDINGS: Normal heart size, mediastinal contours, and pulmonary vascularity. Mild peribronchial thickening. Minimal LEFT basilar subsegmental atelectasis. Lungs otherwise clear. No pleural effusion or pneumothorax. No acute osseous findings. IMPRESSION: Minimal bronchitic changes and LEFT basilar atelectasis. Electronically Signed   By: Lavonia Dana M.D.   On: 11/24/2015 15:31   Assessment/Plan Recurrent, Perineal abscess  - WBC 10.8, 16.6 on admission - ID:  Doxycycline day #1 - blood cultures pending  Rectal bleeding- GI following, clear liquid diet and tagged RBC scan tomorrow if bleeding recurs  Diverticulosis of  descending and sigmoid colon - last colonoscopy 03/2013  BPH: Flomax and Proscar GERD: IV Protonox  Plan: continue IV antibiotics. Incision and drainage, likely at bedside, this afternoon. Will discuss further surgical management with MD.  Jill Alexanders, [facility] Surgery 11/25/2015, 10:07 AM Pager: [phone] Consults: [phone] Mon-Fri 7:00 am-4:30 pm Sat-Sun 7:00 am-11:30 am

## 2015-11-25 NOTE — Procedures (Signed)
Incision and Drainage Procedure Note  Pre-operative Diagnosis: anterior perirectal abscess  Post-operative Diagnosis: same  Anesthesia: 1% lidocaine   Surgoen: Georganna Skeans, MD  Assist: Melina Modena, [facility]  Procedure Details  The procedure, risks and complications have been discussed in detail (including, but not limited to airway compromise, infection, bleeding) with the patient, and the patient has signed consent to the procedure.  The skin was sterilely prepped and draped over the affected area in the usual fashion. After adequate local anesthesia, I&D with a #11 blade was performed on the perineum anterior to the anus. An ellipse of skin was removed. Cloudy bloody fluid evacuated and sent for culture. Wound was irrigated and packed. The patient was observed until stable.  EBL: 5 cc's  Condition: Tolerated procedure well and Stable   Complications: none   Georganna Skeans, MD, MPH, FACS Trauma: [phone] General Surgery: [phone] .

## 2015-11-25 NOTE — Progress Notes (Signed)
Patient had a medium sized bloody bowel movement.

## 2015-11-25 NOTE — Progress Notes (Signed)
PT Cancellation Note  Patient Details Name: Patrick Shepherd MRN: [id number] DOB: May 23, 1945   Cancelled Treatment:    Reason Eval/Treat Not Completed: PT screened, no needs identified, will sign off Spoke with pt and he reports feeling at baseline with regards to mobility and not needing any PT services. Encouraged pt to ambulate in hallway multiple times per day to maintain strength while in hospital. Per RN and pt, pt ambulating independently within room. Screened and will sign off. Please re consult if any new changes arise with mobility. Thanks.   Patrick Shepherd 11/25/2015, 10:41 AM Wray Kearns, PT, DPT [phone]

## 2015-11-25 NOTE — Progress Notes (Addendum)
Micro Lab contacted RN stated that culture from abscess needed a wound culture order instead. PA paged for orders and stated she will address.

## 2015-11-25 NOTE — Care Management Obs Status (Signed)
MEDICARE OBSERVATION STATUS NOTIFICATION   Patient Details  Name: ACKEEM BAILIN MRN: [id number] Date of Birth: 10-19-1945   Medicare Observation Status Notification Given:  Yes  Patient would not sign.   Carles Collet, RN 11/25/2015, 2:27 PM

## 2015-11-25 NOTE — Care Management Note (Signed)
Case Management Note  Patient Details  Name: GREOGORY SLABY MRN: [id number] Date of Birth: Dec 03, 1945  Subjective/Objective:                 Independent patient from home with wife in [place]. In obs for GI Bleeding. Patient declined any [place] services. States PCP is Dr Donzetta Sprung at [facility]. No other CM needs identified.   Action/Plan:  No CM needs identified.   Expected Discharge Date:                  Expected Discharge Plan:  Home/Self Care  In-House Referral:     Discharge planning Services  CM Consult  Post Acute Care Choice:  NA Choice offered to:  NA  DME Arranged:  N/A DME Agency:  NA  HH Arranged:  NA HH Agency:  NA  Status of Service:  Completed, signed off  If discussed at [place] of Stay Meetings, dates discussed:    Additional Comments:  Carles Collet, RN 11/25/2015, 2:30 PM

## 2015-11-25 NOTE — Consult Note (Signed)
[facility] Gastroenterology Consultation Note  Referring Provider: Dr. Ivor Costa [facility]) Primary Care Physician:  No primary care provider on file.  Reason for Consultation:  hematochezia  HPI: Patrick Shepherd is a 71 y.o. male whom we've been asked to see for hematochezia.  Yesterday, patient had acute onset of hematochezia.  No abdominal pain, constipation, straining, diarrhea, nausea, vomiting, hematemesis.  No clear alleviating or exacerbating factors.  Had several episodes yesterday afternoon and last episode of bleeding was about 3 am today.  He has perineal abscess, and tells me that this might have ruptured last night and might have accounted for some of this bleeding.  Had similar episode about 3 years ago, was hospitalized at [facility], had multiple testing including colonoscopy showing diverticulosis, but bleeding ultimately stopped on its own and no source of the bleeding was identified.  Patient transferred here from [facility] for management, as there were no beds available at [facility] (where he had been previously evaluated for this problem).   Past Medical History  Diagnosis Date  . BPH (benign prostatic hyperplasia)   . GERD (gastroesophageal reflux disease)   . History of blood transfusion   . Sleep apnea     tested at [facility]  . OAB (overactive bladder)   . Diverticulosis   . GIB (gastrointestinal bleeding)   . Spontaneous pneumothorax   . Tobacco abuse   . Macular hole     Past Surgical History  Procedure Laterality Date  . Colonoscopy    . Collapsed lung      back in 1968  . Cyst excision      from his back x 2  . 25 gauge pars plana vitrectomy with 20 gauge mvr port for macular hole Left 04/21/2014    Procedure: 25 GAUGE PARS PLANA VITRECTOMY WITH 20 GAUGE MVR PORT FOR MACULAR HOLE;  Surgeon: Hayden Pedro, MD;  Location: [place];  Service: Ophthalmology;  Laterality: Left;  . Gas/fluid exchange Left 04/21/2014    Procedure: GAS/FLUID EXCHANGE;  Surgeon: Hayden Pedro,  MD;  Location: [place];  Service: Ophthalmology;  Laterality: Left;  . Serum patch Left 04/21/2014    Procedure: SERUM PATCH;  Surgeon: Hayden Pedro, MD;  Location: [place];  Service: Ophthalmology;  Laterality: Left;  Marland Kitchen Membrane peel Left 04/21/2014    Procedure: MEMBRANE PEEL;  Surgeon: Hayden Pedro, MD;  Location: [place];  Service: Ophthalmology;  Laterality: Left;    Prior to Admission medications   Medication Sig Start Date End Date Taking? Authorizing Provider  ENABLEX 15 MG 24 hr tablet Take 15 mg by mouth daily. 03/01/14   Historical Provider, MD  esomeprazole (NEXIUM) 20 MG capsule Take 20 mg by mouth 2 (two) times daily before a meal.    Historical Provider, MD  finasteride (PROSCAR) 5 MG tablet Take 5 mg by mouth daily.    Historical Provider, MD  meloxicam (MOBIC) 15 MG tablet Take 15 mg by mouth daily.    Historical Provider, MD  multivitamin-iron-minerals-folic acid (THERAPEUTIC-M) TABS tablet Take 1 tablet by mouth daily.    Historical Provider, MD  tamsulosin (FLOMAX) 0.4 MG CAPS capsule Take 0.4 mg by mouth daily. 03/01/14   Historical Provider, MD    Current Facility-Administered Medications  Medication Dose Route Frequency Provider Last Rate Last Dose  . 0.9 %  sodium chloride infusion   Intravenous Continuous Ivor Costa, MD 100 mL/hr at 11/25/15 0903    . acetaminophen (TYLENOL) tablet 650 mg  650 mg Oral  Q6H PRN Ivor Costa, MD       Or  . acetaminophen (TYLENOL) suppository 650 mg  650 mg Rectal Q6H PRN Ivor Costa, MD      . darifenacin (ENABLEX) 24 hr tablet 15 mg  15 mg Oral Daily Ivor Costa, MD      . doxycycline (VIBRA-TABS) tablet 100 mg  100 mg Oral Q12H Ivor Costa, MD   100 mg at 11/25/15 0007  . finasteride (PROSCAR) tablet 5 mg  5 mg Oral Daily Ivor Costa, MD      . multivitamin with minerals tablet 1 tablet  1 tablet Oral Daily Ivor Costa, MD      . ondansetron [facility]) injection 4 mg  4 mg Intravenous Q8H PRN Ivor Costa, MD      . oxyCODONE-acetaminophen  (PERCOCET/ROXICET) 5-325 MG per tablet 2 tablet  2 tablet Oral Q6H PRN Ivor Costa, MD      . pantoprazole (PROTONIX) injection 40 mg  40 mg Intravenous Q12H Ivor Costa, MD   40 mg at 11/25/15 0420  . sodium chloride flush (NS) 0.9 % injection 3 mL  3 mL Intravenous Q12H Ivor Costa, MD   3 mL at 11/25/15 0008  . tamsulosin (FLOMAX) capsule 0.4 mg  0.4 mg Oral Daily Ivor Costa, MD        Allergies as of 11/24/2015  . (No Known Allergies)    Family History  Problem Relation Age of Onset  . Leukemia Mother   . Coronary artery disease Father   . Peripheral vascular disease Brother     Social History   Social History  . Marital Status: Married    Spouse Name: N/A  . Number of Children: N/A  . Years of Education: N/A   Occupational History  . Not on file.   Social History Main Topics  . Smoking status: Current Every Day Smoker    Types: Cigars  . Smokeless tobacco: Not on file     Comment: smokes 2 a day  . Alcohol Use: 8.4 oz/week    14 Shots of liquor per week  . Drug Use: No  . Sexual Activity: Not on file   Other Topics Concern  . Not on file   Social History Narrative    Review of Systems: ROS Dr. Blaine Hamper 11/24/15 reviewed and I agree  Physical Exam: Vital signs in last 24 hours: Temp:  [97.8 F (36.6 C)-98.1 F (36.7 C)] 97.8 F (36.6 C) (07/13 0615) Pulse Rate:  [75-128] 75 (07/13 0615) Resp:  [13-26] 18 (07/13 0615) BP: (109-155)/(54-80) 109/54 mmHg (07/13 0615) SpO2:  [92 %-98 %] 94 % (07/13 0615) Weight:  [92.534 kg (204 lb)-94.802 kg (209 lb)] 92.534 kg (204 lb) (07/12 2241) Last BM Date: 11/24/15 General:   Alert,  Well-developed, well-nourished, pleasant and cooperative in NAD, much younger-appearing than stated age Head:  Normocephalic and atraumatic. Eyes:  Sclera clear, no icterus.   Conjunctiva pink. Ears:  Normal auditory acuity. Nose:  No deformity, discharge,  or lesions. Mouth:  No deformity or lesions.  Oropharynx pink & moist. Neck:  Supple; no  masses or thyromegaly. Lungs:  Clear throughout to auscultation.   No wheezes, crackles, or rhonchi. No acute distress. Heart:  Regular rate and rhythm; no murmurs, clicks, rubs,  or gallops. Abdomen:  Soft, nontender and nondistended. No masses, hepatosplenomegaly or hernias noted. Normal bowel sounds, without guarding, and without rebound.     Msk:  Symmetrical without gross deformities. Normal posture. Pulses:  Normal pulses noted.  Extremities:  Without clubbing or edema. Neurologic:  Alert and  oriented x4;  grossly normal neurologically. Skin:  Intact without significant lesions or rashes. Psych:  Alert and cooperative. Normal mood and affect.   Lab Results:  Recent Labs  11/24/15 1416 11/25/15 0030 11/25/15 0538  WBC 16.6* 11.6* 10.8*  HGB 15.0 12.6* 12.1*  HCT 43.7 37.0* 35.7*  PLT 278 246 234   BMET  Recent Labs  11/24/15 1416 11/25/15 0538  NA 137 139  K 3.8 4.2  CL 103 108  CO2 24 25  GLUCOSE 116* 116*  BUN 22* 15  CREATININE 1.07 0.93  CALCIUM 9.0 8.4*   LFT  Recent Labs  11/25/15 0538  PROT 6.1*  ALBUMIN 3.0*  AST 19  ALT 30  ALKPHOS 52  BILITOT 0.7   PT/INR  Recent Labs  11/24/15 1417  LABPROT 13.6  INR 1.02    Studies/Results: Ct Angio Chest Pe W/cm &/or Wo Cm  11/24/2015  CLINICAL DATA:  Chest pain for 3 weeks. EXAM: CT ANGIOGRAPHY CHEST WITH CONTRAST TECHNIQUE: Multidetector CT imaging of the chest was performed using the standard protocol during bolus administration of intravenous contrast. Multiplanar CT image reconstructions and MIPs were obtained to evaluate the vascular anatomy. CONTRAST:  100 mL Isovue 370 COMPARISON:  None. FINDINGS: Mediastinum/Lymph Nodes: No pulmonary emboli or thoracic aortic dissection identified. No masses or pathologically enlarged lymph nodes identified. Mild thoracic aortic atherosclerosis. Normal heart size. No pericardial effusion. Lungs/Pleura: No pulmonary mass, infiltrate, or effusion. Mild bilateral  emphysematous changes. Upper abdomen: Moderate size hiatal hernia. Musculoskeletal: No chest wall mass or suspicious bone lesions identified. Review of the MIP images confirms the above findings. IMPRESSION: 1. No evidence of pulmonary embolus. 2.  Emphysema. (ICD10-J43.9) 3.  Aortic Atherosclerosis (ICD10-170.0) Electronically Signed   By: Kathreen Devoid   On: 11/24/2015 18:32   Dg Chest Portable 1 View  11/24/2015  CLINICAL DATA:  RIGHT lower chest pain, smoker EXAM: PORTABLE CHEST 1 VIEW COMPARISON:  Portable exam 1412 hours without priors for comparison FINDINGS: Normal heart size, mediastinal contours, and pulmonary vascularity. Mild peribronchial thickening. Minimal LEFT basilar subsegmental atelectasis. Lungs otherwise clear. No pleural effusion or pneumothorax. No acute osseous findings. IMPRESSION: Minimal bronchitic changes and LEFT basilar atelectasis. Electronically Signed   By: Lavonia Dana M.D.   On: 11/24/2015 15:31   Impression:  1.  Painless hematochezia, improving, suspect diverticular. 2.  Acute blood loss anemia, mild. 3.  Perineal abscess, ? Ruptured last night per patient. 4.  Prostate cancer, "mild" per patient, monitoring for now (no surgery or radiation).  Plan:  1.  Clear liquid diet. 2.  Follow serial CBCs. 3.  OOBTC, ambulate in room as tolerated. 4.  If patient has recurrent hematochezia, would pursue nuclear medicine tagged RBC study as next step in management. 5.  [facility] will follow.     Landry Dyke  11/25/2015, 9:07 AM  Pager [phone] If no answer or after 5 PM call [phone]

## 2015-11-25 NOTE — Progress Notes (Signed)
PROGRESS NOTE    Patrick Shepherd  [id number] DOB: 02/13/1946 DOA: 11/24/2015 PCP: No primary care provider on file.    Brief Narrative:  Patient 70 year old gentleman history of BPH, gastroesophageal reflux disease, obstructive sleep apnea, diverticulosis, overactive bladder presented to the ED with hematochezia and recurrent perineal abscess. GI and general surgery consulted.  Assessment & Plan:   Principal Problem:   Rectal bleeding Active Problems:   BPH (benign prostatic hyperplasia)   GERD (gastroesophageal reflux disease)   Diverticulosis   Chest pain   Perineal abscess   GIB (gastrointestinal bleeding)  #1 rectal bleed/probable lower GI bleed Patient had presented with hematochezia. Patient states last bloody bowel movement was 3 AM. Hemoglobin currently at 12.1 from 12.6. Patient has been seen in consultation by GI who feel likely a diverticular bleed. Patient has been started on clears. Per GI patient with recurrent hematochezia will likely need a nuclear medicine tagged RBC study. Follow H&H. Per GI.  #2 chest pain Likely musculoskeletal in nature versus secondary to bronchitis.. EKG with no ischemic changes. Patient's chest pain is more right-sided. Chest pain worsens with movement. Cardiac enzymes negative 3. Clinical improvement. Will place on scheduled Ultram 100 mg 3 times daily. Continue oral doxycycline. Follow.  #3 gastroesophageal reflux disease PPI.  #4 recurrent perineal abscess Patient has been seen in consultation by general surgery who recommended a I and D of anterior perirectal abscess at bedside. Patient underwent I and D and tolerated procedure well. Cloudy bloody fluid was evacuated and sent for culture. Wound was irrigated and packed. Continue doxycycline. Per general surgery.  #5 overactive bladder Enablex.  #6 BPH Stable. Continue Flomax and Proscar.     DVT prophylaxis: SCDs Code Status: Full Family Communication: Updated patient  and wife at bedside. Disposition Plan: Home once bleeding has resolved and patient tolerating an oral diet and per GI and general surgery.   Consultants:   Gastroenterology: Dr. Paulita Fujita 11/25/2015  Gen. surgery: Dr. Georganna Skeans 11/25/2015  Procedures:   CT angiogram chest 11/24/2015  Chest x-ray 11/24/2015  Antimicrobials:   None.   Subjective: Patient states rectal bleeding improved. Patient states last bleeding episode was at 3 AM this morning. Patient also states some bleeding from perineal abscess. Patient denies any shortness of breath. No chest pain.  Objective: Filed Vitals:   11/24/15 2241 11/25/15 0615 11/25/15 1000  BP:  109/54   Pulse:  75   Temp:  97.8 F (36.6 C)   Resp:  18   Height: 6' (1.829 m)  6' (1.829 m)  Weight: 92.534 kg (204 lb)  92.534 kg (204 lb)  SpO2:  94%     Intake/Output Summary (Last 24 hours) at 11/25/15 1142 Last data filed at 11/25/15 1131  Gross per 24 hour  Intake 1689.68 ml  Output    700 ml  Net 989.68 ml   Filed Weights   11/24/15 2241 11/25/15 1000  Weight: 92.534 kg (204 lb) 92.534 kg (204 lb)    Examination:  General exam: Appears calm and comfortable  Respiratory system: Clear to auscultation. Respiratory effort normal. Cardiovascular system: S1 & S2 heard, RRR. No JVD, murmurs, rubs, gallops or clicks. No pedal edema. Gastrointestinal system: Abdomen is nondistended, soft and nontender. No organomegaly or masses felt. Normal bowel sounds heard. Central nervous system: Alert and oriented. No focal neurological deficits. Extremities: Symmetric 5 x 5 power. Skin: No rashes, lesions or ulcers Psychiatry: Judgement and insight appear normal. Mood & affect appropriate.  Data Reviewed: I have personally reviewed following labs and imaging studies  CBC:  Recent Labs Lab 11/24/15 1416 11/25/15 0030 11/25/15 0538  WBC 16.6* 11.6* 10.8*  HGB 15.0 12.6* 12.1*  HCT 43.7 37.0* 35.7*  MCV 93.7 93.9 94.4  PLT  278 246 [id number]   Basic Metabolic Panel:  Recent Labs Lab 11/24/15 1416 11/25/15 0538  NA 137 139  K 3.8 4.2  CL 103 108  CO2 24 25  GLUCOSE 116* 116*  BUN 22* 15  CREATININE 1.07 0.93  CALCIUM 9.0 8.4*   GFR: Estimated Creatinine Clearance: 80 mL/min (by C-G formula based on Cr of 0.93). Liver Function Tests:  Recent Labs Lab 11/24/15 1416 11/25/15 0538  AST 26 19  ALT 41 30  ALKPHOS 70 52  BILITOT 0.8 0.7  PROT 7.8 6.1*  ALBUMIN 4.3 3.0*   No results for input(s): LIPASE, AMYLASE in the last 168 hours. No results for input(s): AMMONIA in the last 168 hours. Coagulation Profile:  Recent Labs Lab 11/24/15 1417  INR 1.02   Cardiac Enzymes:  Recent Labs Lab 11/24/15 1417 11/25/15 0030 11/25/15 0538  TROPONINI <0.03 <0.03 <0.03   BNP (last 3 results) No results for input(s): PROBNP in the last 8760 hours. HbA1C: No results for input(s): HGBA1C in the last 72 hours. CBG:  Recent Labs Lab 11/25/15 0750  GLUCAP 103*   Lipid Profile:  Recent Labs  11/25/15 0314  CHOL 176  HDL 28*  LDLCALC 114*  TRIG 171*  CHOLHDL 6.3   Thyroid Function Tests: No results for input(s): TSH, T4TOTAL, FREET4, T3FREE, THYROIDAB in the last 72 hours. Anemia Panel: No results for input(s): VITAMINB12, FOLATE, FERRITIN, TIBC, IRON, RETICCTPCT in the last 72 hours. Sepsis Labs:  Recent Labs Lab 11/25/15 0029 11/25/15 0030 11/25/15 0315  PROCALCITON  --  <0.10  --   LATICACIDVEN 1.0  --  1.0    No results found for this or any previous visit (from the past 240 hour(s)).       Radiology Studies: Ct Angio Chest Pe W/cm &/or Wo Cm  11/24/2015  CLINICAL DATA:  Chest pain for 3 weeks. EXAM: CT ANGIOGRAPHY CHEST WITH CONTRAST TECHNIQUE: Multidetector CT imaging of the chest was performed using the standard protocol during bolus administration of intravenous contrast. Multiplanar CT image reconstructions and MIPs were obtained to evaluate the vascular anatomy.  CONTRAST:  100 mL Isovue 370 COMPARISON:  None. FINDINGS: Mediastinum/Lymph Nodes: No pulmonary emboli or thoracic aortic dissection identified. No masses or pathologically enlarged lymph nodes identified. Mild thoracic aortic atherosclerosis. Normal heart size. No pericardial effusion. Lungs/Pleura: No pulmonary mass, infiltrate, or effusion. Mild bilateral emphysematous changes. Upper abdomen: Moderate size hiatal hernia. Musculoskeletal: No chest wall mass or suspicious bone lesions identified. Review of the MIP images confirms the above findings. IMPRESSION: 1. No evidence of pulmonary embolus. 2.  Emphysema. (ICD10-J43.9) 3.  Aortic Atherosclerosis (ICD10-170.0) Electronically Signed   By: Kathreen Devoid   On: 11/24/2015 18:32   Dg Chest Portable 1 View  11/24/2015  CLINICAL DATA:  RIGHT lower chest pain, smoker EXAM: PORTABLE CHEST 1 VIEW COMPARISON:  Portable exam 1412 hours without priors for comparison FINDINGS: Normal heart size, mediastinal contours, and pulmonary vascularity. Mild peribronchial thickening. Minimal LEFT basilar subsegmental atelectasis. Lungs otherwise clear. No pleural effusion or pneumothorax. No acute osseous findings. IMPRESSION: Minimal bronchitic changes and LEFT basilar atelectasis. Electronically Signed   By: Lavonia Dana M.D.   On: 11/24/2015 15:31  Scheduled Meds: . darifenacin  15 mg Oral Daily  . doxycycline  100 mg Oral Q12H  . finasteride  5 mg Oral Daily  . multivitamin with minerals  1 tablet Oral Daily  . pantoprazole (PROTONIX) IV  40 mg Intravenous Q12H  . sodium chloride flush  3 mL Intravenous Q12H  . tamsulosin  0.4 mg Oral Daily   Continuous Infusions: . sodium chloride 100 mL/hr at 11/25/15 0903        Time spent: 63 mins    THOMPSON,DANIEL, MD [facility] Pager [phone]  If 7PM-7AM, please contact night-coverage [email or web address] Password [facility] 11/25/2015, 11:42 AM

## 2015-11-25 NOTE — Progress Notes (Addendum)
Patient had stool with bright red blood. On-call coverage. [place] notified.  Will continue to monitor and follow MD orders.

## 2015-11-25 NOTE — Progress Notes (Signed)
Dr.Thompson at bedside. MD provided verbal orders for consent which was obtain at bedside as well.

## 2015-11-26 DIAGNOSIS — K625 Hemorrhage of anus and rectum: Secondary | ICD-10-CM | POA: Diagnosis not present

## 2015-11-26 DIAGNOSIS — K219 Gastro-esophageal reflux disease without esophagitis: Secondary | ICD-10-CM | POA: Diagnosis not present

## 2015-11-26 DIAGNOSIS — K5791 Diverticulosis of intestine, part unspecified, without perforation or abscess with bleeding: Secondary | ICD-10-CM

## 2015-11-26 DIAGNOSIS — L02215 Cutaneous abscess of perineum: Secondary | ICD-10-CM | POA: Diagnosis not present

## 2015-11-26 DIAGNOSIS — R079 Chest pain, unspecified: Secondary | ICD-10-CM | POA: Diagnosis not present

## 2015-11-26 DIAGNOSIS — K611 Rectal abscess: Secondary | ICD-10-CM | POA: Diagnosis not present

## 2015-11-26 DIAGNOSIS — K921 Melena: Secondary | ICD-10-CM | POA: Diagnosis not present

## 2015-11-26 LAB — GLUCOSE, CAPILLARY: GLUCOSE-CAPILLARY: 118 mg/dL — AB (ref 65–99)

## 2015-11-26 LAB — BASIC METABOLIC PANEL
Anion gap: 4 — ABNORMAL LOW (ref 5–15)
BUN: 9 mg/dL (ref 6–20)
CHLORIDE: 110 mmol/L (ref 101–111)
CO2: 25 mmol/L (ref 22–32)
CREATININE: 0.94 mg/dL (ref 0.61–1.24)
Calcium: 8.5 mg/dL — ABNORMAL LOW (ref 8.9–10.3)
GFR calc Af Amer: >60 mL/min (ref >60)
GFR calc non Af Amer: >60 mL/min (ref >60)
Glucose, Bld: 103 mg/dL — ABNORMAL HIGH (ref 65–99)
POTASSIUM: 3.9 mmol/L (ref 3.5–5.1)
SODIUM: 139 mmol/L (ref 135–145)

## 2015-11-26 LAB — CBC
HEMATOCRIT: 31.8 % — AB (ref 39.0–52.0)
HEMOGLOBIN: 10.9 g/dL — AB (ref 13.0–17.0)
MCH: 32.5 pg (ref 26.0–34.0)
MCHC: 34.3 g/dL (ref 30.0–36.0)
MCV: 94.9 fL (ref 78.0–100.0)
Platelets: 218 10*3/uL (ref 150–400)
RBC: 3.35 MIL/uL — AB (ref 4.22–5.81)
RDW: 13.5 % (ref 11.5–15.5)
WBC: 5.9 10*3/uL (ref 4.0–10.5)

## 2015-11-26 MED ORDER — PANTOPRAZOLE SODIUM 40 MG PO TBEC: 40.0000 mg | DELAYED_RELEASE_TABLET | Freq: Two times a day (BID) | ORAL | Status: DC

## 2015-11-26 MED ORDER — DOXYCYCLINE MONOHYDRATE 100 MG PO CAPS: 100.0000 mg | ORAL_CAPSULE | Freq: Two times a day (BID) | ORAL | Status: AC

## 2015-11-26 MED ORDER — MELOXICAM 15 MG PO TABS: 15.0000 mg | ORAL_TABLET | Freq: Every day | ORAL | Status: DC

## 2015-11-26 MED ORDER — OXYCODONE-ACETAMINOPHEN 5-325 MG PO TABS: 1.0000 | ORAL_TABLET | Freq: Four times a day (QID) | ORAL | Status: DC | PRN

## 2015-11-26 MED ORDER — PNEUMOCOCCAL VAC POLYVALENT 25 MCG/0.5ML IJ INJ: 0.5000 mL | INJECTION | INTRAMUSCULAR | Status: DC

## 2015-11-26 NOTE — Discharge Summary (Signed)
Physician Discharge Summary  Patrick Shepherd [id number] DOB: 05/21/1945 DOA: 11/24/2015  PCP: No primary care provider on file.  Admit date: 11/24/2015 Discharge date: 11/26/2015  Time spent: 65 minutes  Recommendations for Outpatient Follow-up:  1. Follow-up with gastroenterologist at [facility] in 2-3 weeks. 2. Follow-up with Dr. Leighton Ruff, general surgery for wound check.   Discharge Diagnoses:  Principal Problem:   Rectal bleeding Active Problems:   BPH (benign prostatic hyperplasia)   GERD (gastroesophageal reflux disease)   Diverticulosis   Chest pain   Perineal abscess   GIB (gastrointestinal bleeding)   Discharge Condition: Stable and improved.  Diet recommendation: Regular/soft diet.  Filed Weights   11/24/15 2241 11/25/15 1000  Weight: 92.534 kg (204 lb) 92.534 kg (204 lb)    History of present illness:  Per Patrick Shepherd is a 70 y.o. male with medical history significant of BPH, GERD, OSA, overactive bladder, diverticulosis, GI bleeding, spontaneous pneumothorax, former smoker, macular hole, who presented with rectal bleeding, chest pain and recurrent perineal abscess.  Pt was transferred from [place] due to GIB and lack of GI coverage.  Patient reported that he has had several episodes of rectal bleeding with bright red colored blood in the past 2 days. He has mild abdominal cramping, but no nausea, vomiting or diarrhea. Pt stated that he had similar GIB 2014 and required blood transfusion. He had colonoscopy which was unable to localize source of bleeding.  Pt stated that he has chest pain, which has been going on for almost 2 weeks. It was located right lower chest, just above the rib cage. It was moderate, pleuritic, constant, nonradiating. He did not hava fever, chills, cough or shortness of breath.  Pt stated that he had hx of perineal abscess and underwent I&D twice before. Now he has recurrent perineal abscess, which is painful. He  does not have fever or chills. Patient denied symptoms of UTI, unilateral weakness.  ED Course: pt was found to have hemoglobin 15.0--> 12.6, WBC 16.6, temperature normal, tachycardia, lactate 1.0, INR 1.02, negative troponin, fibrin derivatives elevation 1842, electrolytes and renal function okay. Chest x-ray showed minimal bronchitic change. CT angiogram of the chest is negative for PE, but showed emphysematous change. Patient is placed on telemetry bed for observation. GI was   Hospital Course:  #1 rectal bleed/probable lower GI bleed likely diverticular. Patient had presented with hematochezia. Patient had one bloody bowel movement during the hospitalization. Hemoglobin stablized at 10.9. Patient has been seen in consultation by GI who feel likely a diverticular bleed. Patient was placed on clear liquids and followed. Patient did not have any further bleeding. Patient's diet was advanced to a soft diet which she tolerated. Outpatient follow-up with his gastroenterologist at [facility].   #2 chest pain Likely musculoskeletal in nature versus secondary to bronchitis.. EKG with no ischemic changes. Patient's chest pain is more right-sided. Chest pain worsened with movement. Cardiac enzymes negative 3. Clinical improvement. Patient will follow-up with PCP in the outpatient setting. Patient states that PCP had prescribed an NSAID for him.   #3 gastroesophageal reflux disease Patient was maintained on PPI.  #4 recurrent perineal abscess Patient has been seen in consultation by general surgery who recommended a I and D of anterior perirectal abscess at bedside. Patient underwent I and D and tolerated procedure well. Cloudy bloody fluid was evacuated and sent for culture. Wound was irrigated and packed. Patient was placed empirically on doxycycline on admission was also bronchitis. Patient will follow-up with  general surgery in the outpatient setting.   #5 overactive bladder Enablex.  #6 BPH Stable.  Continued on home regimen Flomax and Proscar.    Procedures:  CT angiogram chest 11/24/2015  Chest x-ray 11/24/2015  Consultations:  Gastroenterology: Dr. Paulita Fujita 11/25/2015  Gen. surgery: Dr. Georganna Skeans 11/25/2015  Discharge Exam: Filed Vitals:   11/26/15 0500 11/26/15 1333  BP: 116/60 141/75  Pulse: 78 76  Temp: 98.1 F (36.7 C) 98.1 F (36.7 C)  Resp: 18 20    General: NAD Cardiovascular: RRR Respiratory: CTAB  Discharge Instructions   Discharge Instructions    Diet general    Complete by:  As directed      Discharge instructions    Complete by:  As directed   Follow up with your GI docs at [facility] in 2-3 weeks. Follow up with General surgery in 2-3 weeks.     Increase activity slowly    Complete by:  As directed           Current Discharge Medication List    START taking these medications   Details  doxycycline (MONODOX) 100 MG capsule Take 1 capsule (100 mg total) by mouth 2 (two) times daily. Take for 6 days then stop. Qty: 12 capsule, Refills: 0    oxyCODONE-acetaminophen (PERCOCET/ROXICET) 5-325 MG tablet Take 1-2 tablets by mouth every 6 (six) hours as needed for severe pain. Qty: 20 tablet, Refills: 0      CONTINUE these medications which have CHANGED   Details  meloxicam (MOBIC) 15 MG tablet Take 1 tablet (15 mg total) by mouth daily.      CONTINUE these medications which have NOT CHANGED   Details  ENABLEX 15 MG 24 hr tablet Take 15 mg by mouth daily.    esomeprazole (NEXIUM) 20 MG capsule Take 20 mg by mouth 2 (two) times daily before a meal.    finasteride (PROSCAR) 5 MG tablet Take 5 mg by mouth daily.    multivitamin-iron-minerals-folic acid (THERAPEUTIC-M) TABS tablet Take 1 tablet by mouth daily.    tamsulosin (FLOMAX) 0.4 MG CAPS capsule Take 0.4 mg by mouth daily.       No Known Allergies Follow-up Information    Schedule an appointment as soon as possible for a visit with Rosario Adie., MD.   Specialty:  General  Surgery   Why:  For wound re-check   Contact information:   [address]  [place] [address] [phone]       Please follow up.   Why:  f/u with GI docs at [facility]       The results of significant diagnostics from this hospitalization (including imaging, microbiology, ancillary and laboratory) are listed below for reference.    Significant Diagnostic Studies: Ct Angio Chest Pe W/cm &/or Wo Cm  11/24/2015  CLINICAL DATA:  Chest pain for 3 weeks. EXAM: CT ANGIOGRAPHY CHEST WITH CONTRAST TECHNIQUE: Multidetector CT imaging of the chest was performed using the standard protocol during bolus administration of intravenous contrast. Multiplanar CT image reconstructions and MIPs were obtained to evaluate the vascular anatomy. CONTRAST:  100 mL Isovue 370 COMPARISON:  None. FINDINGS: Mediastinum/Lymph Nodes: No pulmonary emboli or thoracic aortic dissection identified. No masses or pathologically enlarged lymph nodes identified. Mild thoracic aortic atherosclerosis. Normal heart size. No pericardial effusion. Lungs/Pleura: No pulmonary mass, infiltrate, or effusion. Mild bilateral emphysematous changes. Upper abdomen: Moderate size hiatal hernia. Musculoskeletal: No chest wall mass or suspicious bone lesions identified. Review of the MIP images  confirms the above findings. IMPRESSION: 1. No evidence of pulmonary embolus. 2.  Emphysema. (ICD10-J43.9) 3.  Aortic Atherosclerosis (ICD10-170.0) Electronically Signed   By: Kathreen Devoid   On: 11/24/2015 18:32   Dg Chest Portable 1 View  11/24/2015  CLINICAL DATA:  RIGHT lower chest pain, smoker EXAM: PORTABLE CHEST 1 VIEW COMPARISON:  Portable exam 1412 hours without priors for comparison FINDINGS: Normal heart size, mediastinal contours, and pulmonary vascularity. Mild peribronchial thickening. Minimal LEFT basilar subsegmental atelectasis. Lungs otherwise clear. No pleural effusion or pneumothorax. No acute osseous findings. IMPRESSION: Minimal  bronchitic changes and LEFT basilar atelectasis. Electronically Signed   By: Lavonia Dana M.D.   On: 11/24/2015 15:31    Microbiology: Recent Results (from the past 240 hour(s))  Culture, blood (Routine X 2) w Reflex to ID Panel     Status: None (Preliminary result)   Collection Time: 11/25/15 12:01 AM  Result Value Ref Range Status   Specimen Description BLOOD LEFT ANTECUBITAL  Final   Special Requests IN PEDIATRIC BOTTLE 2CC  Final   Culture NO GROWTH 1 DAY  Final   Report Status PENDING  Incomplete  Culture, blood (Routine X 2) w Reflex to ID Panel     Status: None (Preliminary result)   Collection Time: 11/25/15 12:15 AM  Result Value Ref Range Status   Specimen Description BLOOD LEFT WRIST  Final   Special Requests IN PEDIATRIC BOTTLE 3CC  Final   Culture NO GROWTH 1 DAY  Final   Report Status PENDING  Incomplete  Aerobic Culture (superficial specimen)     Status: None (Preliminary result)   Collection Time: 11/25/15  3:21 PM  Result Value Ref Range Status   Specimen Description WOUND PERINEAL  Final   Special Requests NONE  Final   Gram Stain   Final    MODERATE WBC PRESENT,BOTH PMN AND MONONUCLEAR NO ORGANISMS SEEN    Culture CULTURE REINCUBATED FOR BETTER GROWTH  Final   Report Status PENDING  Incomplete     Labs: Basic Metabolic Panel:  Recent Labs Lab 11/24/15 1416 11/25/15 0538 11/26/15 0549  NA 137 139 139  K 3.8 4.2 3.9  CL 103 108 110  CO2 24 25 25   GLUCOSE 116* 116* 103*  BUN 22* 15 9  CREATININE 1.07 0.93 0.94  CALCIUM 9.0 8.4* 8.5*   Liver Function Tests:  Recent Labs Lab 11/24/15 1416 11/25/15 0538  AST 26 19  ALT 41 30  ALKPHOS 70 52  BILITOT 0.8 0.7  PROT 7.8 6.1*  ALBUMIN 4.3 3.0*   No results for input(s): LIPASE, AMYLASE in the last 168 hours. No results for input(s): AMMONIA in the last 168 hours. CBC:  Recent Labs Lab 11/24/15 1416 11/25/15 0030 11/25/15 0538 11/25/15 1207 11/26/15 0549  WBC 16.6* 11.6* 10.8* 8.3 5.9   HGB 15.0 12.6* 12.1* 11.7* 10.9*  HCT 43.7 37.0* 35.7* 35.2* 31.8*  MCV 93.7 93.9 94.4 95.1 94.9  PLT 278 246 234 234 218   Cardiac Enzymes:  Recent Labs Lab 11/24/15 1417 11/25/15 0030 11/25/15 0538 11/25/15 1207  TROPONINI <0.03 <0.03 <0.03 <0.03   BNP: BNP (last 3 results) No results for input(s): BNP in the last 8760 hours.  ProBNP (last 3 results) No results for input(s): PROBNP in the last 8760 hours.  CBG:  Recent Labs Lab 11/25/15 0750 11/26/15 0752  GLUCAP 103* 118*       Signed:  THOMPSON,DANIEL MD.  [facility] 11/26/2015, 3:19 PM

## 2015-11-26 NOTE — Progress Notes (Signed)
Packing removed with some moderate discomfort.  Some bleeding from the wound, but minimal pus.  Wound looks clean.  Did not repack.    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  We normally refer patient with this problem to our colorectal specialist, Dr.  Leighton Ruff.  Okay to discharge from our stand point.  Patient needs to perform Sitz baths twice daily until healed.  Kathryne Eriksson. Dahlia Bailiff, MD, [place] [phone] [facility]

## 2015-11-26 NOTE — Progress Notes (Signed)
Patient tolerated soft diet lunch tray. MD paged to update

## 2015-11-26 NOTE — Progress Notes (Signed)
Subjective: No further bleeding. Pain from abscess, after drainage, is much better.  Objective: Vital signs in last 24 hours: Temp:  [97.5 F (36.4 C)-98.1 F (36.7 C)] 98.1 F (36.7 C) (07/14 0500) Pulse Rate:  [76-80] 78 (07/14 0500) Resp:  [18-20] 18 (07/14 0500) BP: (116-138)/(60-70) 116/60 mmHg (07/14 0500) SpO2:  [92 %-98 %] 92 % (07/14 0500) Weight change: 0 kg (0 lb) Last BM Date: 11/25/15  PE: GEN:  NAD ABD:  Soft, non-tender  Lab Results: CBC    Component Value Date/Time   WBC 5.9 11/26/2015 0549   RBC 3.35* 11/26/2015 0549   HGB 10.9* 11/26/2015 0549   HCT 31.8* 11/26/2015 0549   PLT 218 11/26/2015 0549   MCV 94.9 11/26/2015 0549   MCH 32.5 11/26/2015 0549   MCHC 34.3 11/26/2015 0549   RDW 13.5 11/26/2015 0549   CMP     Component Value Date/Time   NA 139 11/26/2015 0549   K 3.9 11/26/2015 0549   CL 110 11/26/2015 0549   CO2 25 11/26/2015 0549   GLUCOSE 103* 11/26/2015 0549   BUN 9 11/26/2015 0549   CREATININE 0.94 11/26/2015 0549   CALCIUM 8.5* 11/26/2015 0549   PROT 6.1* 11/25/2015 0538   ALBUMIN 3.0* 11/25/2015 0538   AST 19 11/25/2015 0538   ALT 30 11/25/2015 0538   ALKPHOS 52 11/25/2015 0538   BILITOT 0.7 11/25/2015 0538   GFRNONAA >60 11/26/2015 0549   GFRAA >60 11/26/2015 0549   Assessment:  1. Painless hematochezia, no bleeding in over 24 hours, suspect diverticular. 2. Acute blood loss anemia, mild. 3. Perineal abscess, ? Ruptured last night per patient. 4. Prostate cancer, "mild" per patient, monitoring for now (no surgery or radiation).  Plan:  1.  Advance diet. 2.  OK to discharge home from GI perspective today. 3.  Patient can follow-up with GI physicians at [facility] (where all his prior GI care has been). 4.  Will sign-off; please call with questions; thank you for the consultation.   Landry Dyke 11/26/2015, 10:34 AM   Pager [phone] If no answer or after 5 PM call [phone]

## 2015-11-26 NOTE — Progress Notes (Signed)
Patrick Shepherd to be D/C'd Home per MD order.  Discussed with the patient and all questions fully answered.  VSS. IV catheter discontinued intact. Site without signs and symptoms of complications. Dressing and pressure applied.  An After Visit Summary was printed and given to the patient. Patient received prescription.  D/c education completed with patient/family including follow up instructions, medication list, d/c activities limitations if indicated, with other d/c instructions as indicated by MD - patient able to verbalize understanding, all questions fully answered.   Patient instructed to return to ED, call 911, or call MD for any changes in condition.   Patient to be escorted via [place], and D/C home via private auto.  L'ESPERANCE, [name] C 11/26/2015 3:09 PM

## 2015-11-28 LAB — AEROBIC CULTURE W GRAM STAIN (SUPERFICIAL SPECIMEN)

## 2015-11-28 LAB — AEROBIC CULTURE  (SUPERFICIAL SPECIMEN)

## 2015-11-30 LAB — CULTURE, BLOOD (ROUTINE X 2)
CULTURE: NO GROWTH
Culture: NO GROWTH

## 2016-01-26 DIAGNOSIS — N3281 Overactive bladder: Secondary | ICD-10-CM | POA: Insufficient documentation

## 2016-02-02 ENCOUNTER — Ambulatory Visit (INDEPENDENT_AMBULATORY_CARE_PROVIDER_SITE_OTHER): Payer: Medicare Other | Admitting: Ophthalmology

## 2016-02-02 DIAGNOSIS — H35342 Macular cyst, hole, or pseudohole, left eye: Secondary | ICD-10-CM

## 2016-02-02 DIAGNOSIS — H43811 Vitreous degeneration, right eye: Secondary | ICD-10-CM | POA: Diagnosis not present

## 2016-02-02 DIAGNOSIS — H353111 Nonexudative age-related macular degeneration, right eye, early dry stage: Secondary | ICD-10-CM | POA: Diagnosis not present

## 2016-02-02 DIAGNOSIS — H2511 Age-related nuclear cataract, right eye: Secondary | ICD-10-CM

## 2016-02-10 ENCOUNTER — Other Ambulatory Visit: Payer: Self-pay | Admitting: General Surgery

## 2016-02-10 DIAGNOSIS — L988 Other specified disorders of the skin and subcutaneous tissue: Secondary | ICD-10-CM

## 2016-02-19 ENCOUNTER — Ambulatory Visit
Admission: RE | Admit: 2016-02-19 | Discharge: 2016-02-19 | Disposition: A | Discharge status: Home / Self Care | Payer: Medicare Other | Source: Ambulatory Visit | Attending: General Surgery | Admitting: General Surgery

## 2016-02-19 DIAGNOSIS — L988 Other specified disorders of the skin and subcutaneous tissue: Secondary | ICD-10-CM

## 2016-02-19 MED ORDER — GADOBENATE DIMEGLUMINE 529 MG/ML IV SOLN
19.0000 mL | Freq: Once | INTRAVENOUS | Status: AC | PRN
  Administered 2016-02-19: 19 mL via INTRAVENOUS

## 2016-05-22 DIAGNOSIS — H2511 Age-related nuclear cataract, right eye: Secondary | ICD-10-CM | POA: Diagnosis not present

## 2016-06-27 DIAGNOSIS — H52223 Regular astigmatism, bilateral: Secondary | ICD-10-CM | POA: Diagnosis not present

## 2016-06-27 DIAGNOSIS — Z961 Presence of intraocular lens: Secondary | ICD-10-CM | POA: Diagnosis not present

## 2016-07-18 DIAGNOSIS — C61 Malignant neoplasm of prostate: Secondary | ICD-10-CM | POA: Diagnosis not present

## 2016-07-26 DIAGNOSIS — N3281 Overactive bladder: Secondary | ICD-10-CM | POA: Diagnosis not present

## 2016-07-26 DIAGNOSIS — C61 Malignant neoplasm of prostate: Secondary | ICD-10-CM | POA: Diagnosis not present

## 2016-11-27 DIAGNOSIS — F17290 Nicotine dependence, other tobacco product, uncomplicated: Secondary | ICD-10-CM | POA: Diagnosis not present

## 2016-11-27 DIAGNOSIS — L989 Disorder of the skin and subcutaneous tissue, unspecified: Secondary | ICD-10-CM | POA: Diagnosis not present

## 2016-11-27 DIAGNOSIS — E669 Obesity, unspecified: Secondary | ICD-10-CM | POA: Diagnosis not present

## 2016-11-27 DIAGNOSIS — Z23 Encounter for immunization: Secondary | ICD-10-CM | POA: Diagnosis not present

## 2016-11-27 DIAGNOSIS — Z0000 Encounter for general adult medical examination without abnormal findings: Secondary | ICD-10-CM | POA: Diagnosis not present

## 2016-11-28 DIAGNOSIS — Z0000 Encounter for general adult medical examination without abnormal findings: Secondary | ICD-10-CM | POA: Diagnosis not present

## 2016-11-29 DIAGNOSIS — E782 Mixed hyperlipidemia: Secondary | ICD-10-CM | POA: Insufficient documentation

## 2016-12-13 DIAGNOSIS — L989 Disorder of the skin and subcutaneous tissue, unspecified: Secondary | ICD-10-CM | POA: Diagnosis not present

## 2016-12-13 DIAGNOSIS — L821 Other seborrheic keratosis: Secondary | ICD-10-CM | POA: Diagnosis not present

## 2017-01-16 DIAGNOSIS — C61 Malignant neoplasm of prostate: Secondary | ICD-10-CM | POA: Diagnosis not present

## 2017-01-29 DIAGNOSIS — N3941 Urge incontinence: Secondary | ICD-10-CM | POA: Diagnosis not present

## 2017-01-29 DIAGNOSIS — N5203 Combined arterial insufficiency and corporo-venous occlusive erectile dysfunction: Secondary | ICD-10-CM | POA: Diagnosis not present

## 2017-01-29 DIAGNOSIS — C61 Malignant neoplasm of prostate: Secondary | ICD-10-CM | POA: Diagnosis not present

## 2017-04-10 DIAGNOSIS — E781 Pure hyperglyceridemia: Secondary | ICD-10-CM | POA: Diagnosis not present

## 2017-04-10 DIAGNOSIS — R7303 Prediabetes: Secondary | ICD-10-CM | POA: Diagnosis not present

## 2017-04-16 DIAGNOSIS — E781 Pure hyperglyceridemia: Secondary | ICD-10-CM | POA: Diagnosis not present

## 2017-04-16 DIAGNOSIS — Z9989 Dependence on other enabling machines and devices: Secondary | ICD-10-CM | POA: Diagnosis not present

## 2017-04-16 DIAGNOSIS — G4733 Obstructive sleep apnea (adult) (pediatric): Secondary | ICD-10-CM | POA: Diagnosis not present

## 2017-05-15 DIAGNOSIS — Z85828 Personal history of other malignant neoplasm of skin: Secondary | ICD-10-CM

## 2017-05-15 HISTORY — DX: Personal history of other malignant neoplasm of skin: Z85.828

## 2017-06-20 DIAGNOSIS — Z8546 Personal history of malignant neoplasm of prostate: Secondary | ICD-10-CM | POA: Diagnosis not present

## 2017-06-20 DIAGNOSIS — C44622 Squamous cell carcinoma of skin of right upper limb, including shoulder: Secondary | ICD-10-CM | POA: Diagnosis not present

## 2017-06-20 DIAGNOSIS — L578 Other skin changes due to chronic exposure to nonionizing radiation: Secondary | ICD-10-CM | POA: Diagnosis not present

## 2017-06-20 DIAGNOSIS — D485 Neoplasm of uncertain behavior of skin: Secondary | ICD-10-CM | POA: Diagnosis not present

## 2017-06-30 DIAGNOSIS — C4492 Squamous cell carcinoma of skin, unspecified: Secondary | ICD-10-CM

## 2017-06-30 HISTORY — DX: Squamous cell carcinoma of skin, unspecified: C44.92

## 2017-07-16 DIAGNOSIS — C61 Malignant neoplasm of prostate: Secondary | ICD-10-CM | POA: Diagnosis not present

## 2017-07-30 DIAGNOSIS — N3281 Overactive bladder: Secondary | ICD-10-CM | POA: Diagnosis not present

## 2017-07-30 DIAGNOSIS — C61 Malignant neoplasm of prostate: Secondary | ICD-10-CM | POA: Diagnosis not present

## 2017-08-22 DIAGNOSIS — D225 Melanocytic nevi of trunk: Secondary | ICD-10-CM | POA: Diagnosis not present

## 2017-08-22 DIAGNOSIS — Z85828 Personal history of other malignant neoplasm of skin: Secondary | ICD-10-CM | POA: Diagnosis not present

## 2017-08-22 DIAGNOSIS — L820 Inflamed seborrheic keratosis: Secondary | ICD-10-CM | POA: Diagnosis not present

## 2017-08-22 DIAGNOSIS — D1801 Hemangioma of skin and subcutaneous tissue: Secondary | ICD-10-CM | POA: Diagnosis not present

## 2017-08-22 DIAGNOSIS — L812 Freckles: Secondary | ICD-10-CM | POA: Diagnosis not present

## 2017-08-22 DIAGNOSIS — L720 Epidermal cyst: Secondary | ICD-10-CM | POA: Diagnosis not present

## 2017-08-22 DIAGNOSIS — L570 Actinic keratosis: Secondary | ICD-10-CM | POA: Diagnosis not present

## 2017-08-22 DIAGNOSIS — Z1283 Encounter for screening for malignant neoplasm of skin: Secondary | ICD-10-CM | POA: Diagnosis not present

## 2017-08-22 DIAGNOSIS — D2270 Melanocytic nevi of unspecified lower limb, including hip: Secondary | ICD-10-CM | POA: Diagnosis not present

## 2017-10-18 DIAGNOSIS — G4733 Obstructive sleep apnea (adult) (pediatric): Secondary | ICD-10-CM | POA: Diagnosis not present

## 2017-12-04 DIAGNOSIS — D122 Benign neoplasm of ascending colon: Secondary | ICD-10-CM | POA: Diagnosis not present

## 2017-12-04 DIAGNOSIS — K5730 Diverticulosis of large intestine without perforation or abscess without bleeding: Secondary | ICD-10-CM | POA: Diagnosis not present

## 2017-12-04 DIAGNOSIS — D126 Benign neoplasm of colon, unspecified: Secondary | ICD-10-CM | POA: Diagnosis not present

## 2017-12-04 DIAGNOSIS — Z1211 Encounter for screening for malignant neoplasm of colon: Secondary | ICD-10-CM | POA: Diagnosis not present

## 2017-12-04 DIAGNOSIS — K635 Polyp of colon: Secondary | ICD-10-CM | POA: Diagnosis not present

## 2017-12-04 DIAGNOSIS — D125 Benign neoplasm of sigmoid colon: Secondary | ICD-10-CM | POA: Diagnosis not present

## 2017-12-04 DIAGNOSIS — K648 Other hemorrhoids: Secondary | ICD-10-CM | POA: Diagnosis not present

## 2017-12-04 DIAGNOSIS — Z86010 Personal history of colonic polyps: Secondary | ICD-10-CM | POA: Diagnosis not present

## 2017-12-04 DIAGNOSIS — Z800 Family history of malignant neoplasm of digestive organs: Secondary | ICD-10-CM | POA: Diagnosis not present

## 2017-12-04 DIAGNOSIS — G4733 Obstructive sleep apnea (adult) (pediatric): Secondary | ICD-10-CM | POA: Diagnosis not present

## 2017-12-04 DIAGNOSIS — D124 Benign neoplasm of descending colon: Secondary | ICD-10-CM | POA: Diagnosis not present

## 2017-12-04 DIAGNOSIS — K219 Gastro-esophageal reflux disease without esophagitis: Secondary | ICD-10-CM | POA: Diagnosis not present

## 2017-12-23 IMAGING — MR MR PELVIS WO/W CM
4 of 8 series · 25 of 48 positions shown · IV contrast (multihance)
Comparison: None.

CLINICAL DATA: History of perianal fistula. History of prostate
cancer.

EXAM:
MRI PELVIS WITHOUT AND WITH CONTRAST
TECHNIQUE: Multiplanar multisequence MR imaging of the pelvis was performed
both before and after administration of intravenous contrast.
CONTRAST:  19mL MULTIHANCE GADOBENATE DIMEGLUMINE 529 MG/ML IV SOLN

[Series 2: T2 · sagittal · 2.5mm · 0.81mm/px · 7 of 36 slices shown (1 of 2)]
[im 1/36]
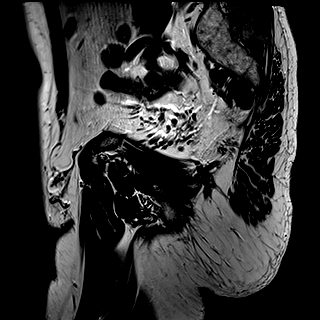
[im 6/36]
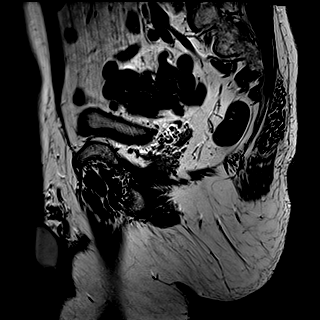
[im 12/36]
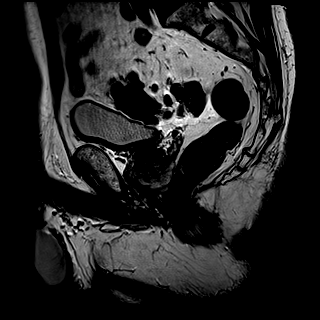
[im 18/36]
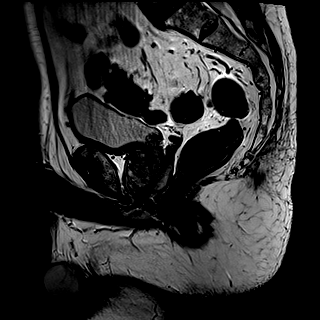
[im 24/36]
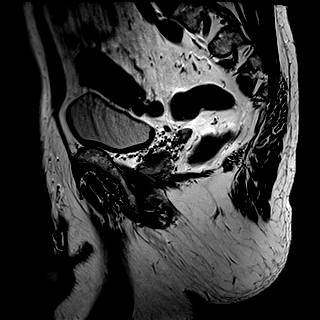
[im 30/36]
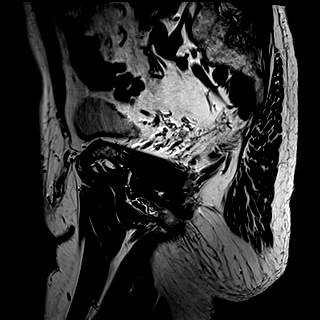
[im 36/36]
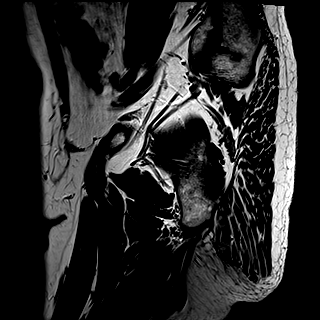

[Series 3: sag 2 fs · sagittal · 2.5mm · 0.41mm/px · 7 of 36 slices shown]
[im 1/36]
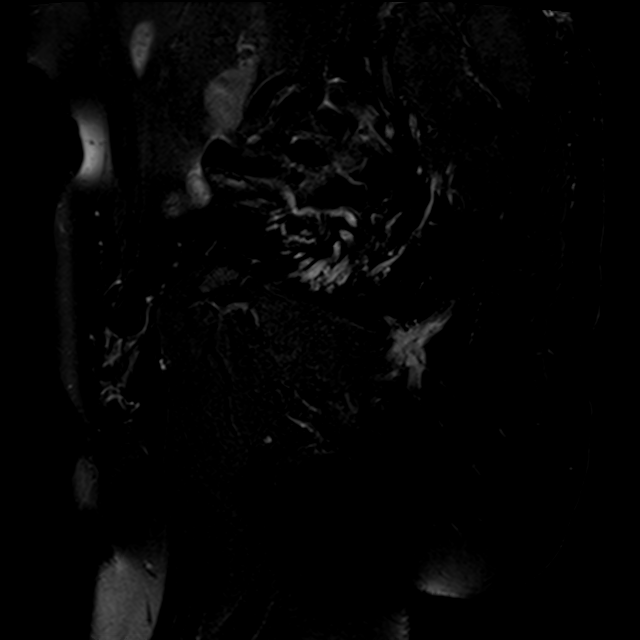
[im 6/36]
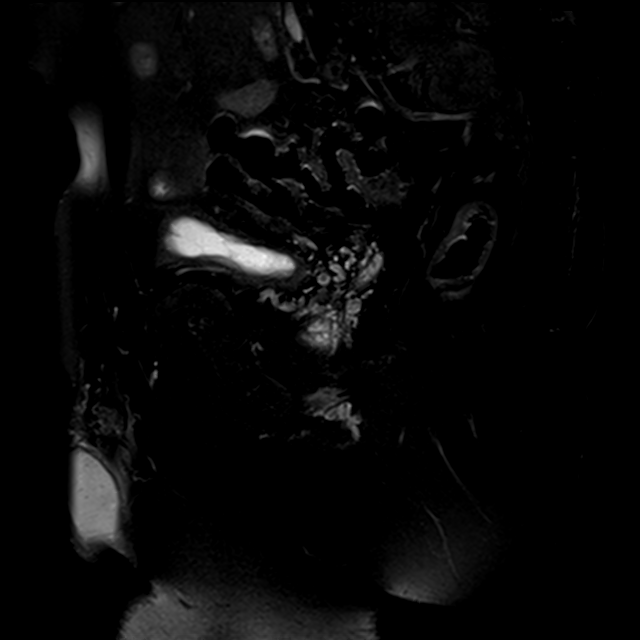
[im 12/36]
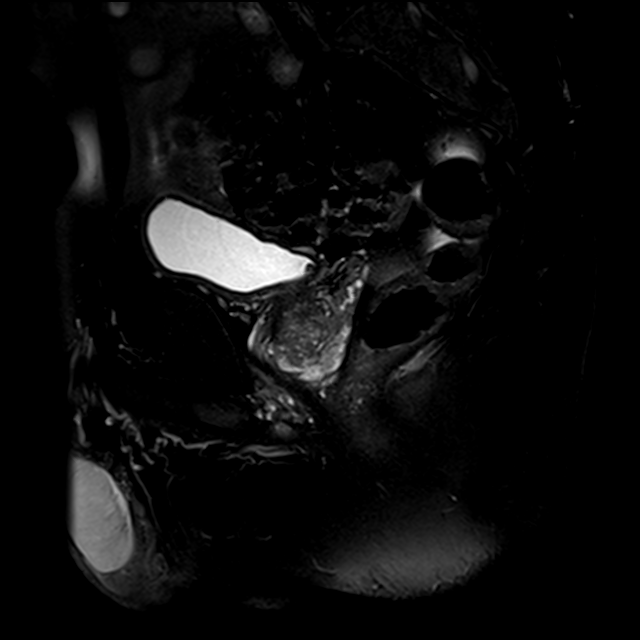
[im 18/36]
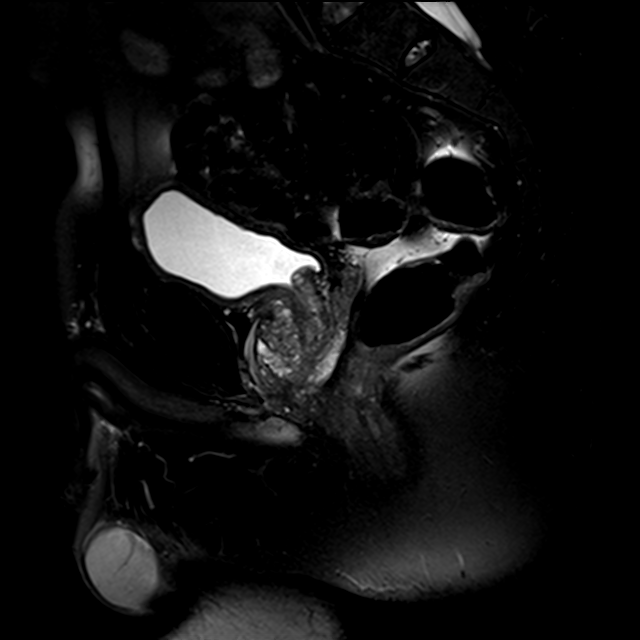
[im 24/36]
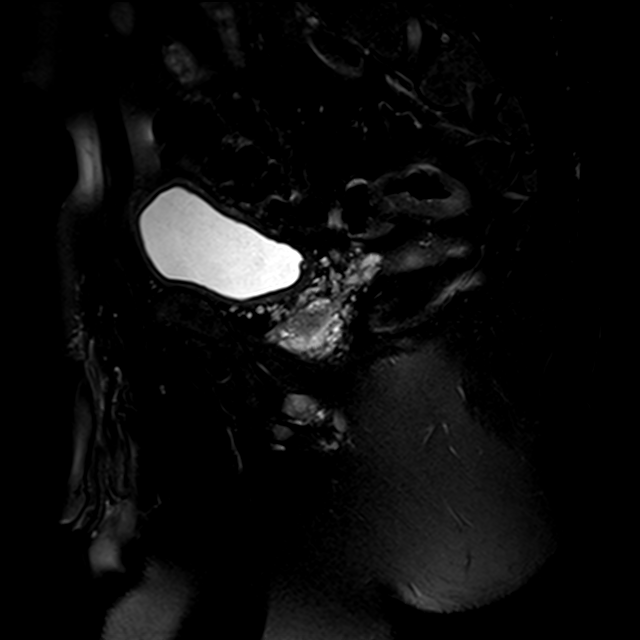
[im 30/36]
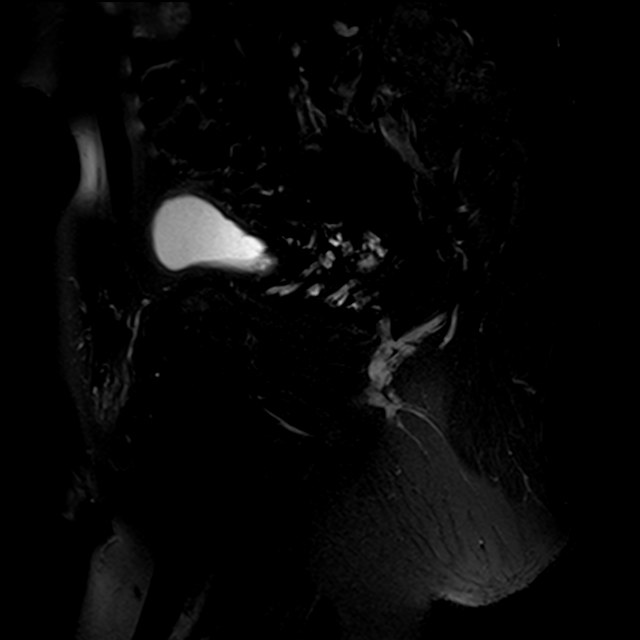
[im 36/36]
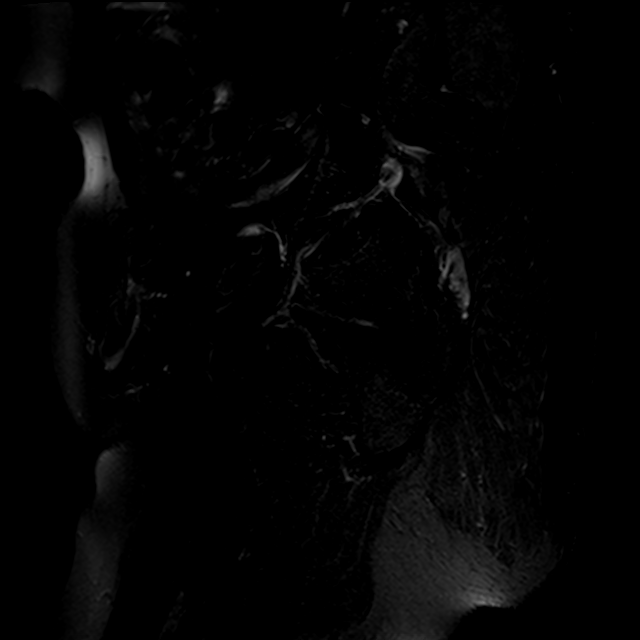

[Series 4: T1 · axial · 4.0mm · 0.57mm/px · z∈[-221,-89]mm · 6 of 32 slices shown]
[im 1/32]
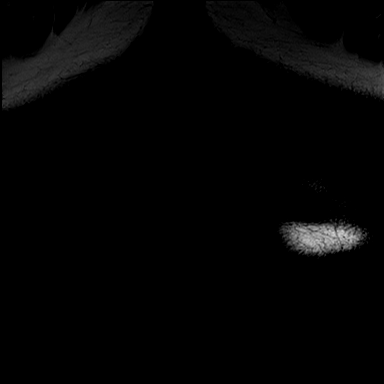
[im 7/32]
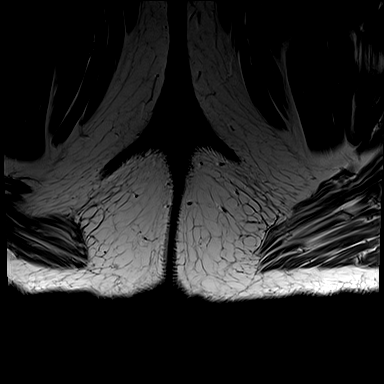
[im 13/32]
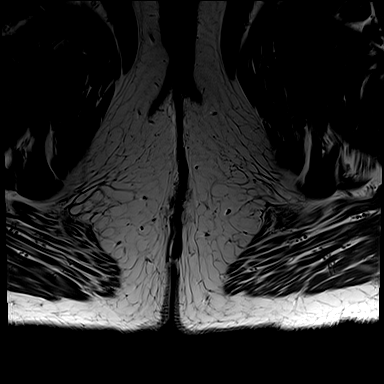
[im 19/32]
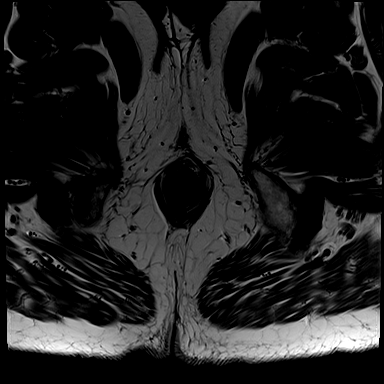
[im 25/32]
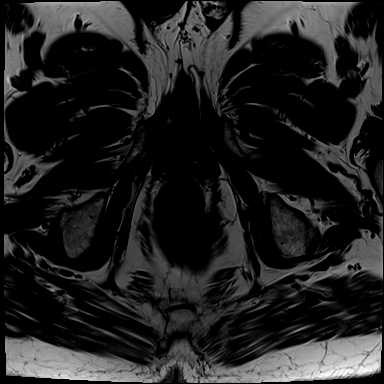
[im 32/32]
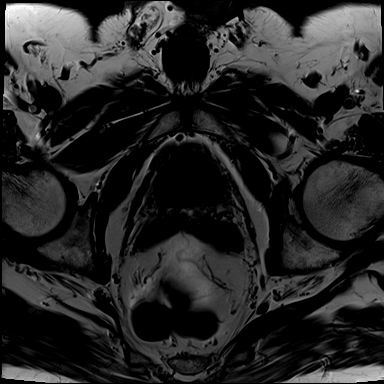

[Series 5: T2 · axial · 4.0mm · 0.57mm/px · z∈[-221,-89]mm · 5 of 32 slices shown (2 of 2)]
[im 1/32]
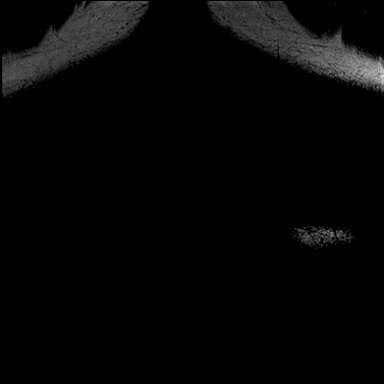
[im 7/32]
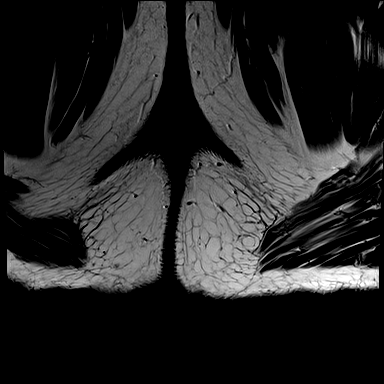
[im 13/32]
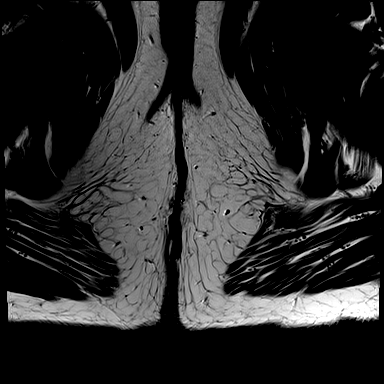
[im 19/32]
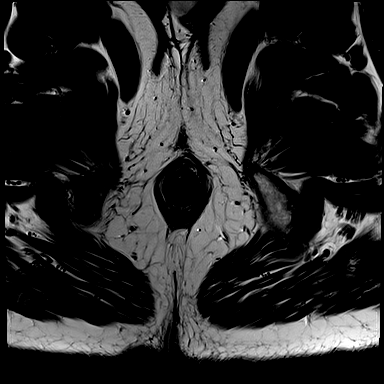
[im 32/32]
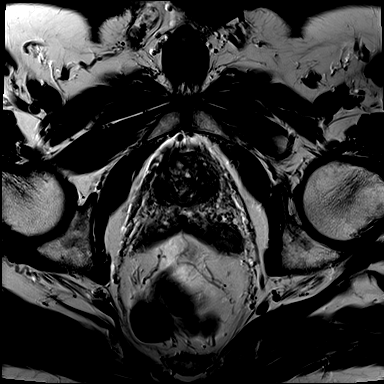

[25 of 48 positions shown; findings below may reference images not displayed]

FINDINGS: No perianal fistula is identified. The perianal fat planes are
maintained. The walls of the internal and external sphincters are
intact. No intersphincteric fluid collections to suggest an abscess.
No surrounding inflammation. The ischiorectal fossa is normal. No
rectal abnormality. No perirectal disease. No pelvic adenopathy.
Mild prostate gland enlargement. The seminal vesicles are grossly
normal. No inguinal mass or adenopathy.

Advanced diverticulosis noted of the sigmoid colon. The bladder is
grossly normal.
IMPRESSION: No MR findings to suggest a perianal fistula or abscess. No rectal
or perirectal disease is identified.

## 2017-12-26 DIAGNOSIS — L219 Seborrheic dermatitis, unspecified: Secondary | ICD-10-CM | POA: Diagnosis not present

## 2017-12-26 DIAGNOSIS — C44629 Squamous cell carcinoma of skin of left upper limb, including shoulder: Secondary | ICD-10-CM | POA: Diagnosis not present

## 2017-12-26 DIAGNOSIS — Z85828 Personal history of other malignant neoplasm of skin: Secondary | ICD-10-CM

## 2017-12-26 DIAGNOSIS — L720 Epidermal cyst: Secondary | ICD-10-CM | POA: Diagnosis not present

## 2017-12-26 DIAGNOSIS — D485 Neoplasm of uncertain behavior of skin: Secondary | ICD-10-CM | POA: Diagnosis not present

## 2017-12-26 DIAGNOSIS — L578 Other skin changes due to chronic exposure to nonionizing radiation: Secondary | ICD-10-CM | POA: Diagnosis not present

## 2017-12-26 HISTORY — DX: Personal history of other malignant neoplasm of skin: Z85.828

## 2018-01-23 DIAGNOSIS — L02212 Cutaneous abscess of back [any part, except buttock]: Secondary | ICD-10-CM | POA: Diagnosis not present

## 2018-01-23 DIAGNOSIS — R208 Other disturbances of skin sensation: Secondary | ICD-10-CM | POA: Diagnosis not present

## 2018-01-23 DIAGNOSIS — R234 Changes in skin texture: Secondary | ICD-10-CM | POA: Diagnosis not present

## 2018-01-24 DIAGNOSIS — C61 Malignant neoplasm of prostate: Secondary | ICD-10-CM | POA: Diagnosis not present

## 2018-01-24 DIAGNOSIS — G4733 Obstructive sleep apnea (adult) (pediatric): Secondary | ICD-10-CM | POA: Diagnosis not present

## 2018-02-04 DIAGNOSIS — N3281 Overactive bladder: Secondary | ICD-10-CM | POA: Diagnosis not present

## 2018-02-04 DIAGNOSIS — C61 Malignant neoplasm of prostate: Secondary | ICD-10-CM | POA: Diagnosis not present

## 2018-03-27 DIAGNOSIS — L720 Epidermal cyst: Secondary | ICD-10-CM | POA: Diagnosis not present

## 2018-03-27 DIAGNOSIS — L821 Other seborrheic keratosis: Secondary | ICD-10-CM | POA: Diagnosis not present

## 2018-03-27 DIAGNOSIS — D225 Melanocytic nevi of trunk: Secondary | ICD-10-CM | POA: Diagnosis not present

## 2018-03-27 DIAGNOSIS — D2230 Melanocytic nevi of unspecified part of face: Secondary | ICD-10-CM | POA: Diagnosis not present

## 2018-03-27 DIAGNOSIS — Z85828 Personal history of other malignant neoplasm of skin: Secondary | ICD-10-CM | POA: Diagnosis not present

## 2018-03-27 DIAGNOSIS — L578 Other skin changes due to chronic exposure to nonionizing radiation: Secondary | ICD-10-CM | POA: Diagnosis not present

## 2018-03-27 DIAGNOSIS — Z1283 Encounter for screening for malignant neoplasm of skin: Secondary | ICD-10-CM | POA: Diagnosis not present

## 2018-03-27 DIAGNOSIS — L570 Actinic keratosis: Secondary | ICD-10-CM | POA: Diagnosis not present

## 2018-04-10 DIAGNOSIS — E781 Pure hyperglyceridemia: Secondary | ICD-10-CM | POA: Diagnosis not present

## 2018-04-17 DIAGNOSIS — Z23 Encounter for immunization: Secondary | ICD-10-CM | POA: Diagnosis not present

## 2018-04-17 DIAGNOSIS — E782 Mixed hyperlipidemia: Secondary | ICD-10-CM | POA: Diagnosis not present

## 2018-04-17 DIAGNOSIS — Z0000 Encounter for general adult medical examination without abnormal findings: Secondary | ICD-10-CM | POA: Diagnosis not present

## 2018-07-22 DIAGNOSIS — C61 Malignant neoplasm of prostate: Secondary | ICD-10-CM | POA: Diagnosis not present

## 2018-09-18 DIAGNOSIS — C61 Malignant neoplasm of prostate: Secondary | ICD-10-CM | POA: Diagnosis not present

## 2018-09-18 DIAGNOSIS — N3941 Urge incontinence: Secondary | ICD-10-CM | POA: Diagnosis not present

## 2018-09-25 DIAGNOSIS — L578 Other skin changes due to chronic exposure to nonionizing radiation: Secondary | ICD-10-CM | POA: Diagnosis not present

## 2018-09-25 DIAGNOSIS — L821 Other seborrheic keratosis: Secondary | ICD-10-CM | POA: Diagnosis not present

## 2018-09-25 DIAGNOSIS — L720 Epidermal cyst: Secondary | ICD-10-CM | POA: Diagnosis not present

## 2018-09-25 DIAGNOSIS — L570 Actinic keratosis: Secondary | ICD-10-CM | POA: Diagnosis not present

## 2018-09-25 DIAGNOSIS — L812 Freckles: Secondary | ICD-10-CM | POA: Diagnosis not present

## 2018-09-25 DIAGNOSIS — D225 Melanocytic nevi of trunk: Secondary | ICD-10-CM | POA: Diagnosis not present

## 2018-09-25 DIAGNOSIS — Z1283 Encounter for screening for malignant neoplasm of skin: Secondary | ICD-10-CM | POA: Diagnosis not present

## 2018-09-25 DIAGNOSIS — D1800 Hemangioma unspecified site: Secondary | ICD-10-CM | POA: Diagnosis not present

## 2018-10-09 ENCOUNTER — Other Ambulatory Visit: Payer: Self-pay

## 2018-10-10 DIAGNOSIS — E782 Mixed hyperlipidemia: Secondary | ICD-10-CM | POA: Diagnosis not present

## 2018-10-17 DIAGNOSIS — Z0000 Encounter for general adult medical examination without abnormal findings: Secondary | ICD-10-CM | POA: Diagnosis not present

## 2018-10-17 DIAGNOSIS — Z9989 Dependence on other enabling machines and devices: Secondary | ICD-10-CM | POA: Diagnosis not present

## 2018-10-17 DIAGNOSIS — Z23 Encounter for immunization: Secondary | ICD-10-CM | POA: Diagnosis not present

## 2018-10-17 DIAGNOSIS — G4733 Obstructive sleep apnea (adult) (pediatric): Secondary | ICD-10-CM | POA: Diagnosis not present

## 2018-10-17 DIAGNOSIS — E782 Mixed hyperlipidemia: Secondary | ICD-10-CM | POA: Diagnosis not present

## 2018-11-01 DIAGNOSIS — G4733 Obstructive sleep apnea (adult) (pediatric): Secondary | ICD-10-CM | POA: Diagnosis not present

## 2018-11-07 DIAGNOSIS — G4733 Obstructive sleep apnea (adult) (pediatric): Secondary | ICD-10-CM | POA: Diagnosis not present

## 2019-02-14 DIAGNOSIS — G4733 Obstructive sleep apnea (adult) (pediatric): Secondary | ICD-10-CM | POA: Diagnosis not present

## 2019-02-20 DIAGNOSIS — Z20828 Contact with and (suspected) exposure to other viral communicable diseases: Secondary | ICD-10-CM | POA: Diagnosis not present

## 2019-03-26 ENCOUNTER — Other Ambulatory Visit: Payer: Self-pay

## 2019-03-26 ENCOUNTER — Ambulatory Visit (INDEPENDENT_AMBULATORY_CARE_PROVIDER_SITE_OTHER): Payer: Medicare HMO | Admitting: Urology

## 2019-03-26 ENCOUNTER — Encounter: Payer: Self-pay | Admitting: Urology

## 2019-03-26 VITALS — BP 159/77 | HR 79 | Ht 72.0 in | Wt 207.0 lb

## 2019-03-26 DIAGNOSIS — N401 Enlarged prostate with lower urinary tract symptoms: Secondary | ICD-10-CM

## 2019-03-26 DIAGNOSIS — R350 Frequency of micturition: Secondary | ICD-10-CM

## 2019-03-26 DIAGNOSIS — C61 Malignant neoplasm of prostate: Secondary | ICD-10-CM | POA: Diagnosis not present

## 2019-03-26 DIAGNOSIS — N5203 Combined arterial insufficiency and corporo-venous occlusive erectile dysfunction: Secondary | ICD-10-CM

## 2019-03-26 DIAGNOSIS — N400 Enlarged prostate without lower urinary tract symptoms: Secondary | ICD-10-CM | POA: Diagnosis not present

## 2019-03-26 LAB — URINALYSIS, COMPLETE
Bilirubin, UA: NEGATIVE
Glucose, UA: NEGATIVE
Ketones, UA: NEGATIVE
Leukocytes,UA: NEGATIVE
Nitrite, UA: NEGATIVE
Protein,UA: NEGATIVE
RBC, UA: NEGATIVE
Specific Gravity, UA: 1.025 (ref 1.005–1.030)
Urobilinogen, Ur: 0.2 mg/dL (ref 0.2–1.0)
pH, UA: 6.5 (ref 5.0–7.5)

## 2019-03-26 LAB — MICROSCOPIC EXAMINATION
Bacteria, UA: NONE SEEN
RBC, Urine: NONE SEEN /hpf (ref 0–2)

## 2019-03-26 LAB — BLADDER SCAN AMB NON-IMAGING

## 2019-03-26 NOTE — Progress Notes (Signed)
03/26/2019 8:59 AM   Patrick Shepherd Sep 23, 1945 [id number]  Referring provider: Coolidge Breeze, MD [place],  [place] [address]  Chief Complaint  Patient presents with  . Benign Prostatic Hypertrophy    New patient  . Erectile Dysfunction  . Prostate Cancer    HPI: 73 year old male with personal history of prostate cancer and erectile dysfunction who presents today to establish care.  He was previously followed at [facility].  He is to drive to [facility] but now his [place] has moved and is seeking to establish care closer to home.  He has a presence of low risk prostate cancer on active surveillance.  Per his note the 2, his first prostate biopsy was done in June and 2010 by Dr. Gwenlyn Saran which was positive for ASAP.  He underwent repeat prostate biopsy in 2012 which was a 36 core biopsy which showed ASAP as well as Gleason 3+3 prostate cancer in a single core at the right lateral mid.  Repeat prostate biopsy later that year showed similar findings.  Present biopsy 2014 was negative.  He had a prostate MRI in 2016 (vol 47,9 cc) which showed no evidence of disease.  Most recent PSA 1.26 in 07/22/2018.  His presents today for erectile dysfunction is previously tried Viagra and Cialis which were not effective.  He have a history of BPH with lower urinary tract symptoms/ OAB.  He also has a history of urge incontinence.  He is currently on Flomax, finasteride and Toviaz.  He also previously tried Enablex and finds Toviaz more effective.  Symptoms are stable.  He is pleased overall.  IPSS as below.  No history of UTIs or dysuria.  No gross hematuria.  IPSS    Row Name 03/26/19 0800         International Prostate Symptom Score   How often have you had the sensation of not emptying your bladder?  Less than 1 in 5     How often have you had to urinate less than every two hours?  Almost always     How often have you found you stopped and started again several  times when you urinated?  Not at All     How often have you found it difficult to postpone urination?  Almost always     How often have you had a weak urinary stream?  More than half the time     How often have you had to strain to start urination?  Not at All     How many times did you typically get up at night to urinate?  1 Time     Total IPSS Score  16       Quality of Life due to urinary symptoms   If you were to spend the rest of your life with your urinary condition just the way it is now how would you feel about that?  Mostly Disatisfied        Score:  1-7 Mild 8-19 Moderate 20-35 Severe   SHIM    Row Name 03/26/19 0831         SHIM: Over the last 6 months:   How do you rate your confidence that you could get and keep an erection?  Very Low     When you had erections with sexual stimulation, how often were your erections hard enough for penetration (entering your partner)?  Almost Always or Always     During sexual intercourse,  how often were you able to maintain your erection after you had penetrated (entered) your partner?  Almost Always or Always     During sexual intercourse, how difficult was it to maintain your erection to completion of intercourse?  Not Difficult     When you attempted sexual intercourse, how often was it satisfactory for you?  Almost Always or Always       SHIM Total Score   SHIM  21          PMH: Past Medical History:  Diagnosis Date  . BPH (benign prostatic hyperplasia)   . Diverticulosis   . GERD (gastroesophageal reflux disease)   . GIB (gastrointestinal bleeding)   . History of blood transfusion   . Hx of squamous cell carcinoma of skin 2019   R mid lateral forearm, L anterior shoulder  . Macular hole   . OAB (overactive bladder)   . Sleep apnea    tested at [facility]  . Spontaneous pneumothorax   . Tobacco abuse     Surgical History: Past Surgical History:  Procedure Laterality Date  . [place] VITRECTOMY WITH 20  GAUGE MVR PORT FOR MACULAR HOLE Left 04/21/2014   Procedure: 25 GAUGE PARS PLANA VITRECTOMY WITH 20 GAUGE MVR PORT FOR MACULAR HOLE;  Surgeon: Hayden Pedro, MD;  Location: [place];  Service: Ophthalmology;  Laterality: Left;  . collapsed lung     back in 1968  . COLONOSCOPY    . CYST EXCISION     from his back x 2  . GAS/FLUID EXCHANGE Left 04/21/2014   Procedure: GAS/FLUID EXCHANGE;  Surgeon: Hayden Pedro, MD;  Location: [place];  Service: Ophthalmology;  Laterality: Left;  . MEMBRANE PEEL Left 04/21/2014   Procedure: MEMBRANE PEEL;  Surgeon: Hayden Pedro, MD;  Location: [place];  Service: Ophthalmology;  Laterality: Left;  . SERUM PATCH Left 04/21/2014   Procedure: SERUM PATCH;  Surgeon: Hayden Pedro, MD;  Location: [place];  Service: Ophthalmology;  Laterality: Left;    Home Medications:  Allergies as of 03/26/2019   No Known Allergies     Medication List       Accurate as of March 26, 2019  8:59 AM. If you have any questions, ask your nurse or doctor.        STOP taking these medications   Enablex 15 MG 24 hr tablet Generic drug: darifenacin Stopped by: Hollice Espy, MD   meloxicam 15 MG tablet Commonly known as: MOBIC Stopped by: Hollice Espy, MD   oxyCODONE-acetaminophen 5-325 MG tablet Commonly known as: PERCOCET/ROXICET Stopped by: Hollice Espy, MD     TAKE these medications   esomeprazole 20 MG capsule Commonly known as: NEXIUM Take 20 mg by mouth 2 (two) times daily before a meal.   finasteride 5 MG tablet Commonly known as: PROSCAR Take 5 mg by mouth daily.   Multi-Vitamin tablet   multivitamin-iron-minerals-folic acid Tabs tablet Take 1 tablet by mouth daily.   tamsulosin 0.4 MG Caps capsule Commonly known as: FLOMAX Take 0.4 mg by mouth daily.   Toviaz 8 MG Tb24 tablet Generic drug: fesoterodine       Allergies: No Known Allergies  Family History: Family History  Problem Relation Age of Onset  . Leukemia Mother   . Coronary  artery disease Father   . Peripheral vascular disease Brother     Social History:  reports that he has been smoking cigars. He has never used smokeless tobacco. He reports  current alcohol use of about 14.0 standard drinks of alcohol per week. He reports that he does not use drugs.  ROS: UROLOGY Frequent Urination?: Yes Hard to postpone urination?: Yes Burning/pain with urination?: No Get up at night to urinate?: No Leakage of urine?: Yes Urine stream starts and stops?: No Trouble starting stream?: No Do you have to strain to urinate?: No Blood in urine?: No Urinary tract infection?: No Sexually transmitted disease?: No Injury to kidneys or bladder?: No Painful intercourse?: No Weak stream?: No Erection problems?: Yes Penile pain?: No  Gastrointestinal Nausea?: No Vomiting?: No Indigestion/heartburn?: No Diarrhea?: No Constipation?: No  Constitutional Fever: No Night sweats?: No Weight loss?: No Fatigue?: No  Skin Skin rash/lesions?: No Itching?: No  Eyes Blurred vision?: No Double vision?: No  Ears/Nose/Throat Sore throat?: No Sinus problems?: No  Hematologic/Lymphatic Swollen glands?: No Easy bruising?: No  Cardiovascular Leg swelling?: No Chest pain?: No  Respiratory Cough?: No Shortness of breath?: No  Endocrine Excessive thirst?: No  Musculoskeletal Back pain?: No Joint pain?: No  Neurological Headaches?: No Dizziness?: No  Psychologic Depression?: No Anxiety?: No  Physical Exam: BP (!) 159/77   Pulse 79   Ht 6' (1.829 m)   Wt 207 lb (93.9 kg)   BMI 28.07 kg/m   Constitutional:  Alert and oriented, No acute distress. HEENT: [place] AT, moist mucus membranes.  Trachea midline, no masses. Cardiovascular: No clubbing, cyanosis, or edema. Respiratory: Normal respiratory effort, no increased work of breathing. Rectal: Slightly decreased sphincter tone.  40 cc prostate, nontender no nodules. Skin: No rashes, bruises or suspicious  lesions. Neurologic: Grossly intact, no focal deficits, moving all 4 extremities. Psychiatric: Normal mood and affect.  Laboratory Data: Lab Results  Component Value Date   WBC 5.9 11/26/2015   HGB 10.9 (L) 11/26/2015   HCT 31.8 (L) 11/26/2015   MCV 94.9 11/26/2015   PLT 218 11/26/2015    Lab Results  Component Value Date   CREATININE 0.94 11/26/2015   PSA as abov  Lab Results  Component Value Date   HGBA1C 5.3 11/25/2015    Urinalysis UA reviewed, negative other than as per HPI  Pertinent Imaging: Results for orders placed or performed in visit on 03/26/19  Bladder Scan (Post Void Residual) in office  Result Value Ref Range   Scan Result 30ML      Assessment & Plan:    1. Prostate cancer [facility]) History of low risk prostate cancer on active surveillance, PSA is remained stable.  Recommend continued annual PSA and rectal exam, consider MRI versus biopsy if his PSA starts to rise significantly.  He is agreeable this plan.  PSA and rectal exam updated today. - PSA  2. Combined arterial insufficiency and corporo-venous occlusive erectile dysfunction Previously failed PDE 5 inhibitors.  Discussed alternatives including IPP in interim cavernosal injections today at length.  He is not sure in pursuing any of these.  All questions answered.  He will let us know if he changes mind.  3. Benign prostatic hyperplasia with urinary frequency Chronically managed with Flomax, finasteride and Toviaz  He was this commendation is effective for him and his symptoms are significantly improved from his previous baseline  We discussed whether or not he is ever discussed surgical intervention, risk and benefits discussed.  He is not interested in pursuing any of this and will continue to prefer pharmacotherapy.  Continue above meds, will refill.  Follow-up in 1 year. - Urinalysis, Complete - Bladder Scan (Post Void Residual) in office  Return in about 1 year (around 03/25/2020) for  IPSS/ PVR/ UA, PSA/ DRE.  Hollice Espy, MD  [facility] [address] [place], [place] [address] [phone]

## 2019-03-27 ENCOUNTER — Telehealth: Payer: Self-pay | Admitting: *Deleted

## 2019-03-27 LAB — PSA: Prostate Specific Ag, Serum: 1.2 ng/mL (ref 0.0–4.0)

## 2019-03-27 NOTE — Telephone Encounter (Addendum)
Left VM asked to return call with any questions.    ----- Message from Hollice Espy, MD sent at 03/27/2019  7:57 AM EST ----- Doristine Devoid news, PSA 1.2.  See you next year.    Hollice Espy, MD

## 2019-04-15 DIAGNOSIS — E782 Mixed hyperlipidemia: Secondary | ICD-10-CM | POA: Diagnosis not present

## 2019-04-16 DIAGNOSIS — Z1283 Encounter for screening for malignant neoplasm of skin: Secondary | ICD-10-CM | POA: Diagnosis not present

## 2019-04-16 DIAGNOSIS — D229 Melanocytic nevi, unspecified: Secondary | ICD-10-CM | POA: Diagnosis not present

## 2019-04-16 DIAGNOSIS — L219 Seborrheic dermatitis, unspecified: Secondary | ICD-10-CM | POA: Diagnosis not present

## 2019-04-16 DIAGNOSIS — C44629 Squamous cell carcinoma of skin of left upper limb, including shoulder: Secondary | ICD-10-CM | POA: Diagnosis not present

## 2019-04-16 DIAGNOSIS — L570 Actinic keratosis: Secondary | ICD-10-CM | POA: Diagnosis not present

## 2019-04-16 DIAGNOSIS — Z85828 Personal history of other malignant neoplasm of skin: Secondary | ICD-10-CM | POA: Diagnosis not present

## 2019-04-16 DIAGNOSIS — L720 Epidermal cyst: Secondary | ICD-10-CM | POA: Diagnosis not present

## 2019-04-16 DIAGNOSIS — D225 Melanocytic nevi of trunk: Secondary | ICD-10-CM | POA: Diagnosis not present

## 2019-04-16 DIAGNOSIS — D1801 Hemangioma of skin and subcutaneous tissue: Secondary | ICD-10-CM | POA: Diagnosis not present

## 2019-04-22 DIAGNOSIS — Z87891 Personal history of nicotine dependence: Secondary | ICD-10-CM | POA: Diagnosis not present

## 2019-04-22 DIAGNOSIS — E785 Hyperlipidemia, unspecified: Secondary | ICD-10-CM | POA: Diagnosis not present

## 2019-04-22 DIAGNOSIS — Z0000 Encounter for general adult medical examination without abnormal findings: Secondary | ICD-10-CM | POA: Diagnosis not present

## 2019-05-01 DIAGNOSIS — L720 Epidermal cyst: Secondary | ICD-10-CM | POA: Diagnosis not present

## 2019-05-01 DIAGNOSIS — L02212 Cutaneous abscess of back [any part, except buttock]: Secondary | ICD-10-CM | POA: Diagnosis not present

## 2019-06-25 DIAGNOSIS — G4733 Obstructive sleep apnea (adult) (pediatric): Secondary | ICD-10-CM | POA: Diagnosis not present

## 2019-08-18 DIAGNOSIS — E782 Mixed hyperlipidemia: Secondary | ICD-10-CM | POA: Diagnosis not present

## 2019-08-25 DIAGNOSIS — Z87891 Personal history of nicotine dependence: Secondary | ICD-10-CM | POA: Diagnosis not present

## 2019-08-25 DIAGNOSIS — Z8546 Personal history of malignant neoplasm of prostate: Secondary | ICD-10-CM | POA: Diagnosis not present

## 2019-08-25 DIAGNOSIS — M25551 Pain in right hip: Secondary | ICD-10-CM | POA: Diagnosis not present

## 2019-08-25 DIAGNOSIS — Z79899 Other long term (current) drug therapy: Secondary | ICD-10-CM | POA: Diagnosis not present

## 2019-08-25 DIAGNOSIS — M25552 Pain in left hip: Secondary | ICD-10-CM | POA: Diagnosis not present

## 2019-08-25 DIAGNOSIS — G4733 Obstructive sleep apnea (adult) (pediatric): Secondary | ICD-10-CM | POA: Diagnosis not present

## 2019-08-25 DIAGNOSIS — E782 Mixed hyperlipidemia: Secondary | ICD-10-CM | POA: Diagnosis not present

## 2019-08-25 DIAGNOSIS — Z9989 Dependence on other enabling machines and devices: Secondary | ICD-10-CM | POA: Diagnosis not present

## 2019-09-24 DIAGNOSIS — G4733 Obstructive sleep apnea (adult) (pediatric): Secondary | ICD-10-CM | POA: Diagnosis not present

## 2019-11-03 ENCOUNTER — Ambulatory Visit: Payer: Medicare HMO | Admitting: Dermatology

## 2019-11-03 ENCOUNTER — Other Ambulatory Visit: Payer: Self-pay

## 2019-11-03 ENCOUNTER — Encounter: Payer: Self-pay | Admitting: Dermatology

## 2019-11-03 DIAGNOSIS — L578 Other skin changes due to chronic exposure to nonionizing radiation: Secondary | ICD-10-CM

## 2019-11-03 DIAGNOSIS — L820 Inflamed seborrheic keratosis: Secondary | ICD-10-CM | POA: Diagnosis not present

## 2019-11-03 DIAGNOSIS — Z85828 Personal history of other malignant neoplasm of skin: Secondary | ICD-10-CM | POA: Diagnosis not present

## 2019-11-03 DIAGNOSIS — L570 Actinic keratosis: Secondary | ICD-10-CM

## 2019-11-03 DIAGNOSIS — L720 Epidermal cyst: Secondary | ICD-10-CM | POA: Diagnosis not present

## 2019-11-03 DIAGNOSIS — L821 Other seborrheic keratosis: Secondary | ICD-10-CM | POA: Diagnosis not present

## 2019-11-03 DIAGNOSIS — L219 Seborrheic dermatitis, unspecified: Secondary | ICD-10-CM | POA: Diagnosis not present

## 2019-11-03 MED ORDER — KETOCONAZOLE 2 % EX CREA: 1.0000 "application " | TOPICAL_CREAM | Freq: Every day | CUTANEOUS | 6 refills | Status: AC

## 2019-11-03 MED ORDER — HYDROCORTISONE 2.5 % EX LOTN: TOPICAL_LOTION | Freq: Every day | CUTANEOUS | 6 refills | Status: DC

## 2019-11-03 NOTE — Progress Notes (Signed)
   Follow-Up Visit   Subjective  Patrick Shepherd is a 74 y.o. male who presents for the following: Follow-up (AK follow up of right nasal tip treated with LN2 04/16/2019. Biopsy follow up - SCC of left post shoulder treated with [facility] 04/16/2019).  The following portions of the chart were reviewed this encounter and updated as appropriate:  Tobacco  Allergies  Meds  Problems  Med Hx  Surg Hx  Fam Hx     Review of Systems:  No other skin or systemic complaints except as noted in HPI or Assessment and Plan.  Objective  Well appearing patient in no apparent distress; mood and affect are within normal limits.  A focused examination was performed including scalp, face, arms, back. Relevant physical exam findings are noted in the Assessment and Plan.  Objective  Nasolabial areas: Erythematous plaques with greasy scale  Objective  Scalp: Erythematous keratotic or waxy stuck-on papule or plaque.   Objective  Dorsum hands (4): Erythematous thin papules/macules with gritty scale.   Objective  Left Upper Back, Left mid back: 1.5 cm cystic papule of left mid back.    0.5 cm cystic papule of left upper back.   Assessment & Plan    Actinic Damage - diffuse scaly erythematous macules with underlying dyspigmentation - Recommend daily broad spectrum sunscreen SPF 30+ to sun-exposed areas, reapply every 2 hours as needed.  - Call for new or changing lesions.  Seborrheic Keratoses - Stuck-on, waxy, tan-brown papules and plaques  - Discussed benign etiology and prognosis. - Observe - Call for any changes   Seborrheic dermatitis Nasolabial areas  hydrocortisone 2.5 % lotion - Nasolabial areas  ketoconazole (NIZORAL) 2 % cream - Nasolabial areas  Inflamed seborrheic keratosis Scalp  Destruction of lesion - Scalp Complexity: simple   Destruction method: cryotherapy   Informed consent: discussed and consent obtained   Timeout:  patient name, date of birth, surgical site,  and procedure verified Lesion destroyed using liquid nitrogen: Yes   Region frozen until ice ball extended beyond lesion: Yes   Outcome: patient tolerated procedure well with no complications   Post-procedure details: wound care instructions given    AK (actinic keratosis) (4) Dorsum hands  Destruction of lesion - Dorsum hands Complexity: simple   Destruction method: cryotherapy   Informed consent: discussed and consent obtained   Timeout:  patient name, date of birth, surgical site, and procedure verified Lesion destroyed using liquid nitrogen: Yes   Region frozen until ice ball extended beyond lesion: Yes   Outcome: patient tolerated procedure well with no complications   Post-procedure details: wound care instructions given    Epidermal inclusion cyst (2) Left Upper Back; Left mid back  Discussed excision. Will plan to excise cyst of left mid back first since it is the biggest. Will discussed excision of other cyst at surgery appointment.  Return in about 3 months (around 02/03/2020) for for cyst excision and a 6 month appointment for AK follow up.  I, Ashok Cordia, CMA, am acting as scribe for Sarina Ser, MD .  Documentation: I have reviewed the above documentation for accuracy and completeness, and I agree with the above.  Sarina Ser, MD

## 2019-11-03 NOTE — Patient Instructions (Signed)

## 2019-11-04 ENCOUNTER — Encounter: Payer: Self-pay | Admitting: Dermatology

## 2019-12-02 ENCOUNTER — Other Ambulatory Visit: Payer: Self-pay | Admitting: *Deleted

## 2019-12-02 MED ORDER — FINASTERIDE 5 MG PO TABS: 5.0000 mg | ORAL_TABLET | Freq: Every day | ORAL | 1 refills | Status: DC

## 2019-12-02 MED ORDER — TAMSULOSIN HCL 0.4 MG PO CAPS: 0.4000 mg | ORAL_CAPSULE | Freq: Every day | ORAL | 1 refills | Status: DC

## 2019-12-02 MED ORDER — TOVIAZ 8 MG PO TB24: 8.0000 mg | ORAL_TABLET | Freq: Every day | ORAL | 1 refills | Status: DC

## 2019-12-18 DIAGNOSIS — H35373 Puckering of macula, bilateral: Secondary | ICD-10-CM | POA: Diagnosis not present

## 2019-12-31 DIAGNOSIS — G4733 Obstructive sleep apnea (adult) (pediatric): Secondary | ICD-10-CM | POA: Diagnosis not present

## 2020-02-03 ENCOUNTER — Telehealth: Payer: Self-pay

## 2020-02-03 ENCOUNTER — Other Ambulatory Visit: Payer: Self-pay

## 2020-02-03 ENCOUNTER — Ambulatory Visit (INDEPENDENT_AMBULATORY_CARE_PROVIDER_SITE_OTHER): Payer: Medicare HMO | Admitting: Dermatology

## 2020-02-03 DIAGNOSIS — L720 Epidermal cyst: Secondary | ICD-10-CM | POA: Diagnosis not present

## 2020-02-03 MED ORDER — MUPIROCIN 2 % EX OINT: 1.0000 "application " | TOPICAL_OINTMENT | Freq: Every day | CUTANEOUS | 0 refills | Status: DC

## 2020-02-03 NOTE — Patient Instructions (Signed)

## 2020-02-03 NOTE — Progress Notes (Signed)
   Follow-Up Visit   Subjective  Patrick Shepherd is a 74 y.o. male who presents for the following: Cyst (L mid back, pt presents for excision).  The following portions of the chart were reviewed this encounter and updated as appropriate:  Tobacco  Allergies  Meds  Problems  Med Hx  Surg Hx  Fam Hx     Review of Systems:  No other skin or systemic complaints except as noted in HPI or Assessment and Plan.  Objective  Well appearing patient in no apparent distress; mood and affect are within normal limits.  A focused examination was performed including back. Relevant physical exam findings are noted in the Assessment and Plan.  Objective  Left mid back: Subcutaneous nodule 2.5 x 1.5cm   Assessment & Plan  Epidermal cyst Left mid back  Start Mupirocin oint qd  Skin excision - Left mid back  Lesion length (cm):  2.5 Lesion width (cm):  1.5 Margin per side (cm):  0 Total excision diameter (cm):  2.5 Informed consent: discussed and consent obtained   Timeout: patient name, date of birth, surgical site, and procedure verified   Procedure prep:  Patient was prepped and draped in usual sterile fashion Prep type:  Isopropyl alcohol and povidone-iodine Anesthesia: the lesion was anesthetized in a standard fashion   Anesthetic:  1% lidocaine w/ epinephrine 1-100,000 buffered w/ 8.4% NaHCO3 (13cc) Instrument used: #15 blade   Hemostasis achieved with: pressure   Hemostasis achieved with comment:  Electrocautery Outcome: patient tolerated procedure well with no complications   Dressing: Mupirocin.    Skin repair - Left mid back Complexity:  Complex Final length (cm):  3 Reason for type of repair: reduce tension to allow closure, reduce the risk of dehiscence, infection, and necrosis, reduce subcutaneous dead space and avoid a hematoma, allow closure of the large defect, preserve normal anatomy, preserve normal anatomical and functional relationships and enhance both  functionality and cosmetic results   Undermining: area extensively undermined   Undermining comment:  Undermining Defect 1.8 Subcutaneous layers (deep stitches):  Suture size:  2-0 Suture type: Vicryl (polyglactin 910)   Subcutaneous suture technique: Inverted Dermal. Fine/surface layer approximation (top stitches):  Suture size:  3-0 Suture type: nylon   Stitches: simple running   Suture removal (days):  7 Hemostasis achieved with: suture and pressure Outcome: patient tolerated procedure well with no complications   Post-procedure details: sterile dressing applied and wound care instructions given   Dressing type: bandage, pressure dressing and bacitracin (Mupirocin)    mupirocin ointment (BACTROBAN) 2 % - Left mid back  Specimen 1 - Surgical pathology Differential Diagnosis: D48.5 Cyst vs other Check Margins: yes Subcutaneous nodule 2.5 x 1.5cm  Return in about 1 week (around 02/10/2020) for suture removal.  I, [name], RMA, am acting as scribe for Sarina Ser, MD .  Documentation: I have reviewed the above documentation for accuracy and completeness, and I agree with the above.  Sarina Ser, MD

## 2020-02-03 NOTE — Telephone Encounter (Signed)
Pt doing fine after today's surgery./sh 

## 2020-02-04 ENCOUNTER — Encounter: Payer: Self-pay | Admitting: Dermatology

## 2020-02-10 ENCOUNTER — Encounter: Payer: Self-pay | Admitting: Dermatology

## 2020-02-10 ENCOUNTER — Other Ambulatory Visit: Payer: Self-pay

## 2020-02-10 ENCOUNTER — Ambulatory Visit (INDEPENDENT_AMBULATORY_CARE_PROVIDER_SITE_OTHER): Payer: Medicare HMO | Admitting: Dermatology

## 2020-02-10 DIAGNOSIS — Z4802 Encounter for removal of sutures: Secondary | ICD-10-CM

## 2020-02-10 DIAGNOSIS — L720 Epidermal cyst: Secondary | ICD-10-CM

## 2020-02-10 NOTE — Progress Notes (Signed)
   Follow-Up Visit   Subjective  Patrick Shepherd is a 74 y.o. male who presents for the following: Suture / Staple Removal (1 week f/u suture removal L mid back biopsy proven Cyst )  The following portions of the chart were reviewed this encounter and updated as appropriate:  Tobacco  Allergies  Meds  Problems  Med Hx  Surg Hx  Fam Hx     Review of Systems:  No other skin or systemic complaints except as noted in HPI or Assessment and Plan.  Objective  Well appearing patient in no apparent distress; mood and affect are within normal limits.  A focused examination was performed including L mid back . Relevant physical exam findings are noted in the Assessment and Plan.  Objective  Left mid back: Well healed scar    Assessment & Plan  Epidermal inclusion cyst Left mid back  Biopsy results discussed, biopsy proven Cyst L mid back,   Encounter for Removal of Sutures - Incision site at the L mid back is clean, dry and intact - Wound cleansed, sutures removed, wound cleansed and steri strips applied.  - Discussed pathology results showing Cyst  - Patient advised to keep steri-strips dry until they fall off. - Scars remodel for a full year. - Once steri-strips fall off, patient can apply over-the-counter silicone scar cream each night to help with scar remodeling if desired. - Patient advised to call with any concerns or if they notice any new or changing lesions.   Return for as scheduled in January 2022.  IMarye Round, CMA, am acting as scribe for Sarina Ser, MD .  Documentation: I have reviewed the above documentation for accuracy and completeness, and I agree with the above.  Sarina Ser, MD

## 2020-02-19 DIAGNOSIS — E782 Mixed hyperlipidemia: Secondary | ICD-10-CM | POA: Diagnosis not present

## 2020-02-26 DIAGNOSIS — G4733 Obstructive sleep apnea (adult) (pediatric): Secondary | ICD-10-CM | POA: Diagnosis not present

## 2020-02-26 DIAGNOSIS — M1611 Unilateral primary osteoarthritis, right hip: Secondary | ICD-10-CM | POA: Diagnosis not present

## 2020-02-26 DIAGNOSIS — M25351 Other instability, right hip: Secondary | ICD-10-CM | POA: Diagnosis not present

## 2020-02-26 DIAGNOSIS — M47816 Spondylosis without myelopathy or radiculopathy, lumbar region: Secondary | ICD-10-CM | POA: Diagnosis not present

## 2020-02-26 DIAGNOSIS — Z9989 Dependence on other enabling machines and devices: Secondary | ICD-10-CM | POA: Diagnosis not present

## 2020-02-26 DIAGNOSIS — E782 Mixed hyperlipidemia: Secondary | ICD-10-CM | POA: Diagnosis not present

## 2020-02-26 DIAGNOSIS — K219 Gastro-esophageal reflux disease without esophagitis: Secondary | ICD-10-CM | POA: Diagnosis not present

## 2020-03-25 ENCOUNTER — Other Ambulatory Visit: Payer: Self-pay | Admitting: *Deleted

## 2020-03-25 DIAGNOSIS — C61 Malignant neoplasm of prostate: Secondary | ICD-10-CM

## 2020-03-26 ENCOUNTER — Other Ambulatory Visit: Payer: Self-pay

## 2020-03-26 ENCOUNTER — Other Ambulatory Visit: Payer: Medicare HMO

## 2020-03-26 DIAGNOSIS — C61 Malignant neoplasm of prostate: Secondary | ICD-10-CM

## 2020-03-27 LAB — PSA: Prostate Specific Ag, Serum: 1.1 ng/mL (ref 0.0–4.0)

## 2020-03-30 ENCOUNTER — Encounter: Payer: Self-pay | Admitting: Urology

## 2020-03-30 ENCOUNTER — Other Ambulatory Visit: Payer: Self-pay

## 2020-03-30 ENCOUNTER — Ambulatory Visit: Payer: Medicare HMO | Admitting: Urology

## 2020-03-30 VITALS — BP 144/76 | HR 94 | Ht 72.0 in | Wt 207.0 lb

## 2020-03-30 DIAGNOSIS — N401 Enlarged prostate with lower urinary tract symptoms: Secondary | ICD-10-CM

## 2020-03-30 DIAGNOSIS — C61 Malignant neoplasm of prostate: Secondary | ICD-10-CM

## 2020-03-30 DIAGNOSIS — N5203 Combined arterial insufficiency and corporo-venous occlusive erectile dysfunction: Secondary | ICD-10-CM | POA: Diagnosis not present

## 2020-03-30 DIAGNOSIS — R350 Frequency of micturition: Secondary | ICD-10-CM

## 2020-03-30 LAB — MICROSCOPIC EXAMINATION: Bacteria, UA: NONE SEEN

## 2020-03-30 LAB — URINALYSIS, COMPLETE
Bilirubin, UA: NEGATIVE
Glucose, UA: NEGATIVE
Leukocytes,UA: NEGATIVE
Nitrite, UA: NEGATIVE
Protein,UA: NEGATIVE
RBC, UA: NEGATIVE
Specific Gravity, UA: >1.03 — ABNORMAL HIGH (ref 1.005–1.030)
Urobilinogen, Ur: 0.2 mg/dL (ref 0.2–1.0)
pH, UA: 5 (ref 5.0–7.5)

## 2020-03-30 LAB — BLADDER SCAN AMB NON-IMAGING: Scan Result: 1

## 2020-03-30 MED ORDER — FINASTERIDE 5 MG PO TABS: 5.0000 mg | ORAL_TABLET | Freq: Every day | ORAL | 3 refills | Status: DC

## 2020-03-30 MED ORDER — TOVIAZ 8 MG PO TB24
8.0000 mg | ORAL_TABLET | Freq: Every day | ORAL | 3 refills | Status: DC
Start: 2020-03-30 — End: 2020-03-30

## 2020-03-30 MED ORDER — FINASTERIDE 5 MG PO TABS
5.0000 mg | ORAL_TABLET | Freq: Every day | ORAL | 3 refills | Status: DC
Start: 2020-03-30 — End: 2021-02-28

## 2020-03-30 MED ORDER — TAMSULOSIN HCL 0.4 MG PO CAPS
0.4000 mg | ORAL_CAPSULE | Freq: Every day | ORAL | 3 refills | Status: DC
Start: 2020-03-30 — End: 2020-03-30

## 2020-03-30 MED ORDER — TAMSULOSIN HCL 0.4 MG PO CAPS
0.4000 mg | ORAL_CAPSULE | Freq: Every day | ORAL | 3 refills | Status: DC
Start: 2020-03-30 — End: 2021-02-28

## 2020-03-30 MED ORDER — TOVIAZ 8 MG PO TB24
8.0000 mg | ORAL_TABLET | Freq: Every day | ORAL | 3 refills | Status: DC
Start: 2020-03-30 — End: 2021-02-28

## 2020-03-30 NOTE — Addendum Note (Signed)
Addended by: Alvera Novel on: 03/30/2020 09:44 AM   Modules accepted: Orders

## 2020-03-30 NOTE — Progress Notes (Signed)
03/30/2020 9:27 AM   Patrick Shepherd May 25, 1945 [id number]  Referring provider: Dion Body, MD [facility] [place],  [place] [address]  Chief Complaint  Patient presents with  . Prostate Cancer    HPI: 74 yo male with a personal history of BPH who returns today for routine annual follow-up.  He has a presence of low risk prostate cancer on active surveillance.  Per his note the 2, his first prostate biopsy was done in June and 2010 by Dr. Gwenlyn Saran which was positive for ASAP.  He underwent repeat prostate biopsy in 2012 which was a 30 core biopsy which showed ASAP as well as Gleason 3+3 prostate cancer in a single core at the right lateral mid.  Repeat prostate biopsy later that year showed similar findings.  Present biopsy 2014 was negative.  He had a prostate MRI in 2016 (vol 47,9 cc) which showed no evidence of disease.  Most recent PSA 1.1 on 11/21.    He have a history of BPH with lower urinary tract symptoms/ OAB.  He also has a history of urge incontinence.  He is currently on Flomax, finasteride and Toviaz.  He is overall pleased with his urinary symptoms.  He believes they have stabilized.  He is expressing no side effects from these medications.  He also has baseline erectile dysfunction.  He has put failed PDE 5 inhibitors.  He is not interested in further therapies.   IPSS    Row Name 03/30/20 0900         International Prostate Symptom Score   How often have you had the sensation of not emptying your bladder? Not at All     How often have you had to urinate less than every two hours? About half the time     How often have you found you stopped and started again several times when you urinated? Less than 1 in 5 times     How often have you found it difficult to postpone urination? Almost always     How often have you had a weak urinary stream? Less than 1 in 5 times     How often have you had to strain to start urination? Not at  All     How many times did you typically get up at night to urinate? None     Total IPSS Score 10       Quality of Life due to urinary symptoms   If you were to spend the rest of your life with your urinary condition just the way it is now how would you feel about that? Mostly Satisfied            Score:  1-7 Mild 8-19 Moderate 20-35 Severe   PMH: Past Medical History:  Diagnosis Date  . BPH (benign prostatic hyperplasia)   . Diverticulosis   . GERD (gastroesophageal reflux disease)   . GIB (gastrointestinal bleeding)   . History of blood transfusion   . Hx of squamous cell carcinoma of skin 2019   R mid lateral forearm, L anterior shoulder  . Macular hole   . OAB (overactive bladder)   . Sleep apnea    tested at [facility]  . Spontaneous pneumothorax   . Squamous cell carcinoma of skin 06/30/2017   right mid lat forearm  . Squamous cell carcinoma of skin 12/26/2017   left ant shoulder  . Squamous cell carcinoma of skin 04/16/2019   left post shoulder  .  Tobacco abuse     Surgical History: Past Surgical History:  Procedure Laterality Date  . [place] VITRECTOMY WITH 20 GAUGE MVR PORT FOR MACULAR HOLE Left 04/21/2014   Procedure: 25 GAUGE PARS PLANA VITRECTOMY WITH 20 GAUGE MVR PORT FOR MACULAR HOLE;  Surgeon: Hayden Pedro, MD;  Location: [place];  Service: Ophthalmology;  Laterality: Left;  . collapsed lung     back in 1968  . COLONOSCOPY    . CYST EXCISION     from his back x 2  . GAS/FLUID EXCHANGE Left 04/21/2014   Procedure: GAS/FLUID EXCHANGE;  Surgeon: Hayden Pedro, MD;  Location: [place];  Service: Ophthalmology;  Laterality: Left;  . MEMBRANE PEEL Left 04/21/2014   Procedure: MEMBRANE PEEL;  Surgeon: Hayden Pedro, MD;  Location: [place];  Service: Ophthalmology;  Laterality: Left;  . SERUM PATCH Left 04/21/2014   Procedure: SERUM PATCH;  Surgeon: Hayden Pedro, MD;  Location: [place];  Service: Ophthalmology;  Laterality: Left;    Home  Medications:  Allergies as of 03/30/2020   No Known Allergies     Medication List       Accurate as of March 30, 2020  9:27 AM. If you have any questions, ask your nurse or doctor.        esomeprazole 20 MG capsule Commonly known as: NEXIUM Take 20 mg by mouth 2 (two) times daily before a meal.   finasteride 5 MG tablet Commonly known as: PROSCAR Take 1 tablet (5 mg total) by mouth daily.   hydrocortisone 2.5 % lotion Apply topically at bedtime. Monday, Wednesday, Friday   Multi-Vitamin tablet   multivitamin-iron-minerals-folic acid Tabs tablet Take 1 tablet by mouth daily.   mupirocin ointment 2 % Commonly known as: BACTROBAN Apply 1 application topically daily. Qd to excision site on back   tamsulosin 0.4 MG Caps capsule Commonly known as: FLOMAX Take 1 capsule (0.4 mg total) by mouth daily.   Toviaz 8 MG Tb24 tablet Generic drug: fesoterodine Take 1 tablet (8 mg total) by mouth daily.       Allergies: No Known Allergies  Family History: Family History  Problem Relation Age of Onset  . Leukemia Mother   . Coronary artery disease Father   . Peripheral vascular disease Brother     Social History:  reports that he has been smoking cigars. He has never used smokeless tobacco. He reports current alcohol use of about 14.0 standard drinks of alcohol per week. He reports that he does not use drugs.   Physical Exam: BP (!) 144/76   Pulse 94   Ht 6' (1.829 m)   Wt 207 lb (93.9 kg)   BMI 28.07 kg/m   Constitutional:  Alert and oriented, No acute distress. HEENT: [place] AT, moist mucus membranes.  Trachea midline, no masses. Cardiovascular: No clubbing, cyanosis, or edema. Respiratory: Normal respiratory effort, no increased work of breathing. GI: Abdomen is soft, nontender, nondistended, no abdominal masses Rectal: Slightly decreased sphincter tone.  50+3 prostate, unable to palpate base of prostate due to size, no obvious nodules.  Nontender. Skin: No  rashes, bruises or suspicious lesions. Neurologic: Grossly intact, no focal deficits, moving all 4 extremities. Psychiatric: Normal mood and affect.  Laboratory Data:  Lab Results  Component Value Date   HGBA1C 5.3 11/25/2015    Urinalysis UA today unremarkable, see epic  Pertinent Imaging: Results for orders placed or performed in visit on 03/30/20  Bladder Scan (Post Void Residual) in  office  Result Value Ref Range   Scan Result 1     Assessment & Plan:    1. Benign prostatic hyperplasia with urinary frequency Stable on Flomax, finasteride and Toviaz  Continue his medications-refill sent  Lengthy discussion today about alternatives including outlet procedure such as UroLift, holep, TURP, etc.  Risk and benefits of each were discussed.  We discussed potential side effects of medication especially with progressing age.  At this point time, he is happy with his symptoms would like to stay on this trimodal therapy.  - Bladder Scan (Post Void Residual) in office - Urinalysis, Complete  2. Prostate cancer ([place]) PSA is remained stable, rectal exam is unremarkable  Given very low volume disease with subsequent negative biopsies, will continue to follow him but on annual basis.  PSA rectal exam next year   3. Combined arterial insufficiency and corporo-venous occlusive erectile dysfunction Reviewed again today, not interested in injections, implant, or alternatives   Follow-up 1 year with IPSS/PVR/PSA/DRE  Hollice Espy, MD  [facility] [address] [place], [place] [address] [phone]

## 2020-05-11 DIAGNOSIS — G4733 Obstructive sleep apnea (adult) (pediatric): Secondary | ICD-10-CM | POA: Diagnosis not present

## 2020-06-03 ENCOUNTER — Ambulatory Visit: Payer: Medicare HMO | Admitting: Dermatology

## 2020-06-03 ENCOUNTER — Other Ambulatory Visit: Payer: Self-pay

## 2020-06-03 DIAGNOSIS — L570 Actinic keratosis: Secondary | ICD-10-CM | POA: Diagnosis not present

## 2020-06-03 DIAGNOSIS — L578 Other skin changes due to chronic exposure to nonionizing radiation: Secondary | ICD-10-CM | POA: Diagnosis not present

## 2020-06-03 DIAGNOSIS — D1800 Hemangioma unspecified site: Secondary | ICD-10-CM | POA: Diagnosis not present

## 2020-06-03 DIAGNOSIS — L821 Other seborrheic keratosis: Secondary | ICD-10-CM | POA: Diagnosis not present

## 2020-06-03 DIAGNOSIS — L720 Epidermal cyst: Secondary | ICD-10-CM

## 2020-06-03 DIAGNOSIS — Z1283 Encounter for screening for malignant neoplasm of skin: Secondary | ICD-10-CM

## 2020-06-03 DIAGNOSIS — L814 Other melanin hyperpigmentation: Secondary | ICD-10-CM

## 2020-06-03 DIAGNOSIS — D229 Melanocytic nevi, unspecified: Secondary | ICD-10-CM | POA: Diagnosis not present

## 2020-06-03 DIAGNOSIS — Z85828 Personal history of other malignant neoplasm of skin: Secondary | ICD-10-CM | POA: Diagnosis not present

## 2020-06-03 DIAGNOSIS — D692 Other nonthrombocytopenic purpura: Secondary | ICD-10-CM

## 2020-06-03 NOTE — Patient Instructions (Signed)
Cryotherapy Aftercare  . Wash gently with soap and water everyday.   . Apply Vaseline and Band-Aid daily until healed.  

## 2020-06-03 NOTE — Progress Notes (Signed)
   Follow-Up Visit   Subjective  Patrick Shepherd is a 75 y.o. male who presents for the following: Annual Exam (Mole check, Hx of SCC ). The patient presents for Total-Body Skin Exam (TBSE) for skin cancer screening and mole check.  The following portions of the chart were reviewed this encounter and updated as appropriate:   Tobacco  Allergies  Meds  Problems  Med Hx  Surg Hx  Fam Hx     Review of Systems:  No other skin or systemic complaints except as noted in HPI or Assessment and Plan.  Objective  Well appearing patient in no apparent distress; mood and affect are within normal limits.  A full examination was performed including scalp, head, eyes, ears, nose, lips, neck, chest, axillae, abdomen, back, buttocks, bilateral upper extremities, bilateral lower extremities, hands, feet, fingers, toes, fingernails, and toenails. All findings within normal limits unless otherwise noted below.  Objective  R cheek: Erythematous thin papules/macules with gritty scale.   Objective  L cheek at mandible: 2.5 cm Subcutaneous nodule.   Objective  multiple see history: Well healed scar with no evidence of recurrence, no lymphadenopathy.    Assessment & Plan  AK (actinic keratosis) R cheek  Destruction of lesion - R cheek Complexity: simple   Destruction method: cryotherapy   Informed consent: discussed and consent obtained   Timeout:  patient name, date of birth, surgical site, and procedure verified Lesion destroyed using liquid nitrogen: Yes   Region frozen until ice ball extended beyond lesion: Yes   Outcome: patient tolerated procedure well with no complications   Post-procedure details: wound care instructions given    Epidermal cyst L cheek at mandible  Asymptomatic  Observe   History of SCC (squamous cell carcinoma) of skin multiple see history  Clear. Observe for recurrence. Call clinic for new or changing lesions.  Recommend regular skin exams, daily  broad-spectrum spf 30+ sunscreen use, and photoprotection.     Skin cancer screening   Lentigines - Scattered tan macules - Discussed due to sun exposure - Benign, observe - Call for any changes  Seborrheic Keratoses - Stuck-on, waxy, tan-brown papules and plaques  - Discussed benign etiology and prognosis. - Observe - Call for any changes  Melanocytic Nevi - Tan-brown and/or pink-flesh-colored symmetric macules and papules - Benign appearing on exam today - Observation - Call clinic for new or changing moles - Recommend daily use of broad spectrum spf 30+ sunscreen to sun-exposed areas.   Hemangiomas - Red papules - Discussed benign nature - Observe - Call for any changes  Actinic Damage - Chronic, secondary to cumulative UV/sun exposure - diffuse scaly erythematous macules with underlying dyspigmentation - Recommend daily broad spectrum sunscreen SPF 30+ to sun-exposed areas, reapply every 2 hours as needed.  - Call for new or changing lesions.  Purpura - Chronic; persistent and recurrent.  Treatable, but not curable. - Violaceous macules and patches - Benign - Related to trauma, age, sun damage and/or use of blood thinners, chronic use of topical and/or oral steroids - Observe - Can use OTC arnica containing moisturizer such as Dermend Bruise Formula if desired - Call for worsening or other concerns  Skin cancer screening performed today.  Return in about 1 year (around 06/03/2021) for TBSE.  IMarye Round, CMA, am acting as scribe for Sarina Ser, MD .  Documentation: I have reviewed the above documentation for accuracy and completeness, and I agree with the above.  Sarina Ser, MD

## 2020-06-07 ENCOUNTER — Encounter: Payer: Self-pay | Admitting: Dermatology

## 2020-06-21 DIAGNOSIS — Z20822 Contact with and (suspected) exposure to covid-19: Secondary | ICD-10-CM | POA: Diagnosis not present

## 2020-06-28 DIAGNOSIS — E782 Mixed hyperlipidemia: Secondary | ICD-10-CM | POA: Diagnosis not present

## 2020-06-28 DIAGNOSIS — K219 Gastro-esophageal reflux disease without esophagitis: Secondary | ICD-10-CM | POA: Diagnosis not present

## 2020-07-05 DIAGNOSIS — Z0000 Encounter for general adult medical examination without abnormal findings: Secondary | ICD-10-CM | POA: Diagnosis not present

## 2020-07-05 DIAGNOSIS — Z8546 Personal history of malignant neoplasm of prostate: Secondary | ICD-10-CM | POA: Diagnosis not present

## 2020-08-12 DIAGNOSIS — G4733 Obstructive sleep apnea (adult) (pediatric): Secondary | ICD-10-CM | POA: Diagnosis not present

## 2020-10-28 DIAGNOSIS — M62830 Muscle spasm of back: Secondary | ICD-10-CM | POA: Diagnosis not present

## 2020-11-09 DIAGNOSIS — M545 Low back pain, unspecified: Secondary | ICD-10-CM | POA: Diagnosis not present

## 2020-12-03 DIAGNOSIS — G4733 Obstructive sleep apnea (adult) (pediatric): Secondary | ICD-10-CM | POA: Diagnosis not present

## 2020-12-08 DIAGNOSIS — G4733 Obstructive sleep apnea (adult) (pediatric): Secondary | ICD-10-CM | POA: Diagnosis not present

## 2020-12-20 DIAGNOSIS — Z0100 Encounter for examination of eyes and vision without abnormal findings: Secondary | ICD-10-CM | POA: Diagnosis not present

## 2020-12-20 DIAGNOSIS — H43813 Vitreous degeneration, bilateral: Secondary | ICD-10-CM | POA: Diagnosis not present

## 2020-12-27 DIAGNOSIS — E782 Mixed hyperlipidemia: Secondary | ICD-10-CM | POA: Diagnosis not present

## 2021-01-03 DIAGNOSIS — Z1159 Encounter for screening for other viral diseases: Secondary | ICD-10-CM | POA: Diagnosis not present

## 2021-01-03 DIAGNOSIS — G609 Hereditary and idiopathic neuropathy, unspecified: Secondary | ICD-10-CM | POA: Diagnosis not present

## 2021-01-03 DIAGNOSIS — G4733 Obstructive sleep apnea (adult) (pediatric): Secondary | ICD-10-CM | POA: Diagnosis not present

## 2021-01-03 DIAGNOSIS — E782 Mixed hyperlipidemia: Secondary | ICD-10-CM | POA: Diagnosis not present

## 2021-01-03 DIAGNOSIS — K219 Gastro-esophageal reflux disease without esophagitis: Secondary | ICD-10-CM | POA: Diagnosis not present

## 2021-01-03 DIAGNOSIS — Z9989 Dependence on other enabling machines and devices: Secondary | ICD-10-CM | POA: Diagnosis not present

## 2021-01-05 DIAGNOSIS — H903 Sensorineural hearing loss, bilateral: Secondary | ICD-10-CM | POA: Diagnosis not present

## 2021-01-05 DIAGNOSIS — H60541 Acute eczematoid otitis externa, right ear: Secondary | ICD-10-CM | POA: Diagnosis not present

## 2021-01-08 DIAGNOSIS — G4733 Obstructive sleep apnea (adult) (pediatric): Secondary | ICD-10-CM | POA: Diagnosis not present

## 2021-01-19 DIAGNOSIS — H60543 Acute eczematoid otitis externa, bilateral: Secondary | ICD-10-CM | POA: Diagnosis not present

## 2021-02-08 DIAGNOSIS — G4733 Obstructive sleep apnea (adult) (pediatric): Secondary | ICD-10-CM | POA: Diagnosis not present

## 2021-02-28 ENCOUNTER — Other Ambulatory Visit: Payer: Self-pay | Admitting: Urology

## 2021-03-03 DIAGNOSIS — G8929 Other chronic pain: Secondary | ICD-10-CM | POA: Diagnosis not present

## 2021-03-03 DIAGNOSIS — M545 Low back pain, unspecified: Secondary | ICD-10-CM | POA: Diagnosis not present

## 2021-03-04 DIAGNOSIS — G4733 Obstructive sleep apnea (adult) (pediatric): Secondary | ICD-10-CM | POA: Diagnosis not present

## 2021-03-10 DIAGNOSIS — G4733 Obstructive sleep apnea (adult) (pediatric): Secondary | ICD-10-CM | POA: Diagnosis not present

## 2021-03-30 ENCOUNTER — Other Ambulatory Visit: Payer: Self-pay | Admitting: *Deleted

## 2021-03-30 DIAGNOSIS — R350 Frequency of micturition: Secondary | ICD-10-CM

## 2021-03-30 DIAGNOSIS — C61 Malignant neoplasm of prostate: Secondary | ICD-10-CM

## 2021-03-30 DIAGNOSIS — N401 Enlarged prostate with lower urinary tract symptoms: Secondary | ICD-10-CM

## 2021-03-31 ENCOUNTER — Other Ambulatory Visit: Payer: Self-pay

## 2021-03-31 ENCOUNTER — Other Ambulatory Visit: Payer: Medicare HMO

## 2021-03-31 DIAGNOSIS — C61 Malignant neoplasm of prostate: Secondary | ICD-10-CM | POA: Diagnosis not present

## 2021-04-01 LAB — PSA: Prostate Specific Ag, Serum: 1.2 ng/mL (ref 0.0–4.0)

## 2021-04-04 NOTE — Progress Notes (Signed)
04/05/21 9:54 AM   Patrick Shepherd 12/01/1945 [id number]  Referring provider:  Dion Body, MD [facility] [place],   [id number] Chief Complaint  Patient presents with   Benign Prostatic Hypertrophy     HPI: Patrick Shepherd is a 75 y.o.male with a personal history of BPH, urge incontinence, and low risk prostate cancer, who returns today for 1 year follow-up with IPSS, PVR, PSA, DRE, and UA.   His first prostate biopsy was done in June and 2010 by Dr. Gwenlyn Saran which was positive for ASAP.  He underwent repeat prostate biopsy in 2012 which was a 47 core biopsy which showed ASAP as well as Gleason 3+3 prostate cancer in a single core at the right lateral mid.  Repeat prostate biopsy later that year showed similar findings.  Present biopsy 2014 was negative.  He had a prostate MRI in 2016 (vol 47,9 cc) which showed no evidence of disease.  His most recent PSA as of 03/31/2021 was 1.2.   He also has baseline erectile dysfunction.  He has put failed PDE 5 inhibitors.   He has been on flomax, finasteride and toviaz.   He reports today that his urinary symptoms have remained the same throughout the year. He reports that his Lisbeth Ply cost has increased, he is wondering if there are any other options.     IPSS     Row Name 04/05/21 0900         International Prostate Symptom Score   How often have you had the sensation of not emptying your bladder? Less than 1 in 5     How often have you had to urinate less than every two hours? About half the time     How often have you found you stopped and started again several times when you urinated? Less than 1 in 5 times     How often have you found it difficult to postpone urination? More than half the time     How often have you had a weak urinary stream? Not at All     How often have you had to strain to start urination? Not at All     How many times did you typically get up at night to urinate? 1  Time     Total IPSS Score 10       Quality of Life due to urinary symptoms   If you were to spend the rest of your life with your urinary condition just the way it is now how would you feel about that? Mostly Satisfied              Score:  1-7 Mild 8-19 Moderate 20-35 Severe   PMH: Past Medical History:  Diagnosis Date   BPH (benign prostatic hyperplasia)    Diverticulosis    GERD (gastroesophageal reflux disease)    GIB (gastrointestinal bleeding)    History of blood transfusion    History of SCC (squamous cell carcinoma) of skin 12/26/2017   left anterior shoulder   Hx of squamous cell carcinoma of skin 06/20/2017   R mid lateral forearm,   Macular hole    OAB (overactive bladder)    Sleep apnea    tested at [facility]   Spontaneous pneumothorax    Squamous cell carcinoma of skin 06/30/2017   right mid lat forearm   Squamous cell carcinoma of skin 04/16/2019   left post shoulder   Tobacco abuse     Surgical  History: Past Surgical History:  Procedure Laterality Date   25 GAUGE PARS PLANA VITRECTOMY WITH 20 GAUGE MVR PORT FOR MACULAR HOLE Left 04/21/2014   Procedure: 25 GAUGE PARS PLANA VITRECTOMY WITH 20 GAUGE MVR PORT FOR MACULAR HOLE;  Surgeon: Hayden Pedro, MD;  Location: [place];  Service: Ophthalmology;  Laterality: Left;   collapsed lung     back in [place]     from his back x 2   GAS/FLUID EXCHANGE Left 04/21/2014   Procedure: GAS/FLUID EXCHANGE;  Surgeon: Hayden Pedro, MD;  Location: [place];  Service: Ophthalmology;  Laterality: Left;   MEMBRANE PEEL Left 04/21/2014   Procedure: MEMBRANE PEEL;  Surgeon: Hayden Pedro, MD;  Location: [place];  Service: Ophthalmology;  Laterality: Left;   SERUM PATCH Left 04/21/2014   Procedure: SERUM PATCH;  Surgeon: Hayden Pedro, MD;  Location: [place];  Service: Ophthalmology;  Laterality: Left;    Home Medications:  Allergies as of 04/05/2021   No Known Allergies      Medication List         Accurate as of April 05, 2021  9:54 AM. If you have any questions, ask your nurse or doctor.          STOP taking these medications    esomeprazole 20 MG capsule Commonly known as: [place] by: Hollice Espy, MD       TAKE these medications    finasteride 5 MG tablet Commonly known as: PROSCAR Take 1 tablet (5 mg total) by mouth daily.   hydrocortisone 2.5 % lotion Apply topically at bedtime. Monday, Wednesday, Friday   lovastatin 20 MG tablet Commonly known as: MEVACOR   Multi-Vitamin tablet   multivitamin-iron-minerals-folic acid Tabs tablet Take 1 tablet by mouth daily.   pantoprazole 40 MG tablet Commonly known as: PROTONIX   tamsulosin 0.4 MG Caps capsule Commonly known as: FLOMAX Take 1 capsule (0.4 mg total) by mouth daily.   Toviaz 8 MG Tb24 tablet Generic drug: fesoterodine Take 1 tablet (8 mg total) by mouth daily. What changed: how much to take Changed by: Hollice Espy, MD        Allergies: No Known Allergies  Family History: Family History  Problem Relation Age of Onset   Leukemia Mother    Coronary artery disease Father    Peripheral vascular disease Brother     Social History:  reports that he has been smoking cigars. He has never used smokeless tobacco. He reports current alcohol use of about 14.0 standard drinks per week. He reports that he does not use drugs.   Physical Exam: BP 136/78   Pulse 67   Ht 5\' 11"  (1.803 m)   Wt 200 lb (90.7 kg)   BMI 27.89 kg/m   Constitutional:  Alert and oriented, No acute distress. HEENT: [place] AT, moist mucus membranes.  Trachea midline, no masses. Cardiovascular: No clubbing, cyanosis, or edema. Respiratory: Normal respiratory effort, no increased work of breathing. Rectal: Slightly decreased sphincter tone,  60+  CC prostate, smooth no nodules was unable to palpate the base due to the size  Skin: No rashes, bruises or suspicious lesions. Neurologic: Grossly intact, no  focal deficits, moving all 4 extremities. Psychiatric: Normal mood and affect.  Laboratory Data:  Lab Results  Component Value Date   CREATININE 0.94 11/26/2015     Lab Results  Component Value Date   HGBA1C 5.3 11/25/2015    Urinalysis Unremarkable  Pertinent Imaging: Results for orders placed or performed in visit on 04/05/21  BLADDER SCAN AMB NON-IMAGING  Result Value Ref Range   Scan Result 0 ml       Assessment & Plan:    BPH  - Stable on finasteride, Flomax, and toviaz. His topviaz has increased in cost, he will follow-up with his insurance about this and check his formulary.  He will let us know which is the most cost effective medication for his urinary urgency/OAB symptoms.  I did discuss my concerns as he ages about being on anticholinergic and error more leaning towards a beta 3 agonist that a reasonable cost effective option.  He will call us and let us know.  - Continue his medications   - Discussed alternative outlet procedures again this visit. These include Urolift, HoLEP, TURP, wt. He is not interested at this time.   - Bladder Scan (Post Void Residual) in office  - Urinalysis, Complete   2. History of prostate cancer ([place]) -PSA is remained stable, rectal exam is unremarkable   - Given very low volume disease with subsequent negative biopsies, will continue to follow him but on annual basis.  PSA rectal exam next year  - PSA; pending    Return in 1 year for IPSS, PSA, PVR, DRE, and UA  I,Kailey Littlejohn,acting as a scribe for Hollice Espy, MD.,have documented all relevant documentation on the behalf of Hollice Espy, MD,as directed by  Hollice Espy, MD while in the presence of Hollice Espy, MD.  I have reviewed the above documentation for accuracy and completeness, and I agree with the above.   Hollice Espy, MD  [facility] [address] [place], [place] [address] [phone]

## 2021-04-05 ENCOUNTER — Other Ambulatory Visit: Payer: Self-pay

## 2021-04-05 ENCOUNTER — Ambulatory Visit: Payer: Medicare HMO | Admitting: Urology

## 2021-04-05 VITALS — BP 136/78 | HR 67 | Ht 71.0 in | Wt 200.0 lb

## 2021-04-05 DIAGNOSIS — R350 Frequency of micturition: Secondary | ICD-10-CM | POA: Diagnosis not present

## 2021-04-05 DIAGNOSIS — C61 Malignant neoplasm of prostate: Secondary | ICD-10-CM | POA: Diagnosis not present

## 2021-04-05 DIAGNOSIS — N401 Enlarged prostate with lower urinary tract symptoms: Secondary | ICD-10-CM | POA: Diagnosis not present

## 2021-04-05 LAB — URINALYSIS, COMPLETE
Bilirubin, UA: NEGATIVE
Glucose, UA: NEGATIVE
Leukocytes,UA: NEGATIVE
Nitrite, UA: NEGATIVE
Protein,UA: NEGATIVE
RBC, UA: NEGATIVE
Specific Gravity, UA: 1.025 (ref 1.005–1.030)
Urobilinogen, Ur: 0.2 mg/dL (ref 0.2–1.0)
pH, UA: 5.5 (ref 5.0–7.5)

## 2021-04-05 LAB — MICROSCOPIC EXAMINATION
Bacteria, UA: NONE SEEN
RBC, Urine: NONE SEEN /hpf (ref 0–2)

## 2021-04-05 LAB — BLADDER SCAN AMB NON-IMAGING: Scan Result: 0

## 2021-04-05 MED ORDER — TAMSULOSIN HCL 0.4 MG PO CAPS: 0.4000 mg | ORAL_CAPSULE | Freq: Every day | ORAL | 3 refills | Status: DC

## 2021-04-05 MED ORDER — FINASTERIDE 5 MG PO TABS: 5.0000 mg | ORAL_TABLET | Freq: Every day | ORAL | 3 refills | Status: DC

## 2021-04-05 MED ORDER — TOVIAZ 8 MG PO TB24: 8.0000 mg | ORAL_TABLET | Freq: Every day | ORAL | 3 refills | Status: DC

## 2021-05-10 DIAGNOSIS — G4733 Obstructive sleep apnea (adult) (pediatric): Secondary | ICD-10-CM | POA: Diagnosis not present

## 2021-05-24 DIAGNOSIS — H60541 Acute eczematoid otitis externa, right ear: Secondary | ICD-10-CM | POA: Diagnosis not present

## 2021-06-02 DIAGNOSIS — H60541 Acute eczematoid otitis externa, right ear: Secondary | ICD-10-CM | POA: Diagnosis not present

## 2021-06-09 ENCOUNTER — Other Ambulatory Visit: Payer: Self-pay

## 2021-06-09 ENCOUNTER — Ambulatory Visit: Payer: Medicare HMO | Admitting: Dermatology

## 2021-06-09 DIAGNOSIS — D239 Other benign neoplasm of skin, unspecified: Secondary | ICD-10-CM

## 2021-06-09 DIAGNOSIS — Z85828 Personal history of other malignant neoplasm of skin: Secondary | ICD-10-CM | POA: Diagnosis not present

## 2021-06-09 DIAGNOSIS — L814 Other melanin hyperpigmentation: Secondary | ICD-10-CM | POA: Diagnosis not present

## 2021-06-09 DIAGNOSIS — L570 Actinic keratosis: Secondary | ICD-10-CM | POA: Diagnosis not present

## 2021-06-09 DIAGNOSIS — D485 Neoplasm of uncertain behavior of skin: Secondary | ICD-10-CM

## 2021-06-09 DIAGNOSIS — L821 Other seborrheic keratosis: Secondary | ICD-10-CM | POA: Diagnosis not present

## 2021-06-09 DIAGNOSIS — D1800 Hemangioma unspecified site: Secondary | ICD-10-CM

## 2021-06-09 DIAGNOSIS — Z1283 Encounter for screening for malignant neoplasm of skin: Secondary | ICD-10-CM | POA: Diagnosis not present

## 2021-06-09 DIAGNOSIS — D2261 Melanocytic nevi of right upper limb, including shoulder: Secondary | ICD-10-CM

## 2021-06-09 DIAGNOSIS — L578 Other skin changes due to chronic exposure to nonionizing radiation: Secondary | ICD-10-CM | POA: Diagnosis not present

## 2021-06-09 DIAGNOSIS — D229 Melanocytic nevi, unspecified: Secondary | ICD-10-CM

## 2021-06-09 DIAGNOSIS — L918 Other hypertrophic disorders of the skin: Secondary | ICD-10-CM

## 2021-06-09 HISTORY — DX: Other benign neoplasm of skin, unspecified: D23.9

## 2021-06-09 NOTE — Progress Notes (Signed)
Follow-Up Visit   Subjective  Patrick Shepherd is a 76 y.o. male who presents for the following: Annual Exam (Hx SCC, AK's - patient has noticed a dry scaly patch on his L calf that he would like checked today.). The patient presents for Total-Body Skin Exam (TBSE) for skin cancer screening and mole check.  The patient has spots, moles and lesions to be evaluated, some may be new or changing.  The following portions of the chart were reviewed this encounter and updated as appropriate:   Tobacco   Allergies   Meds   Problems   Med Hx   Surg Hx   Fam Hx      Review of Systems:  No other skin or systemic complaints except as noted in HPI or Assessment and Plan.  Objective  Well appearing patient in no apparent distress; mood and affect are within normal limits.  A full examination was performed including scalp, head, eyes, ears, nose, lips, neck, chest, axillae, abdomen, back, buttocks, bilateral upper extremities, bilateral lower extremities, hands, feet, fingers, toes, fingernails, and toenails. All findings within normal limits unless otherwise noted below.  Right Ear x 1, scalp x 3 (4) Erythematous thin papules/macules with gritty scale.   R post deltoid 0.6 cm dark macule.      Assessment & Plan  AK (actinic keratosis) (4) Right Ear x 1, scalp x 3  Destruction of lesion - Right Ear x 1, scalp x 3 Complexity: simple   Destruction method: cryotherapy   Informed consent: discussed and consent obtained   Timeout:  patient name, date of birth, surgical site, and procedure verified Lesion destroyed using liquid nitrogen: Yes   Region frozen until ice ball extended beyond lesion: Yes   Outcome: patient tolerated procedure well with no complications   Post-procedure details: wound care instructions given    Neoplasm of uncertain behavior of skin R post deltoid  Epidermal / dermal shaving  Lesion diameter (cm):  0.6 Informed consent: discussed and consent obtained    Timeout: patient name, date of birth, surgical site, and procedure verified   Procedure prep:  Patient was prepped and draped in usual sterile fashion Prep type:  Isopropyl alcohol Anesthesia: the lesion was anesthetized in a standard fashion   Anesthetic:  1% lidocaine w/ epinephrine 1-100,000 buffered w/ 8.4% NaHCO3 Instrument used: flexible razor blade   Hemostasis achieved with: pressure, aluminum chloride and electrodesiccation   Outcome: patient tolerated procedure well   Post-procedure details: sterile dressing applied and wound care instructions given   Dressing type: bandage and petrolatum    Epidermal / dermal shaving  Specimen 1 - Surgical pathology Differential Diagnosis: D48.5 r/o dysplastic nevus vs other  Check Margins: No  Lentigines - Scattered tan macules - Due to sun exposure - Benign-appearing, observe - Recommend daily broad spectrum sunscreen SPF 30+ to sun-exposed areas, reapply every 2 hours as needed. - Call for any changes  Seborrheic Keratoses - Stuck-on, waxy, tan-brown papules and/or plaques  - Benign-appearing - Discussed benign etiology and prognosis. - Observe - Call for any changes  Melanocytic Nevi - Tan-brown and/or pink-flesh-colored symmetric macules and papules - Benign appearing on exam today - Observation - Call clinic for new or changing moles - Recommend daily use of broad spectrum spf 30+ sunscreen to sun-exposed areas.   Hemangiomas - Red papules - Discussed benign nature - Observe - Call for any changes  Actinic Damage - Chronic condition, secondary to cumulative UV/sun exposure - diffuse scaly erythematous  macules with underlying dyspigmentation - Recommend daily broad spectrum sunscreen SPF 30+ to sun-exposed areas, reapply every 2 hours as needed.  - Staying in the shade or wearing long sleeves, sun glasses (UVA+UVB protection) and wide brim hats (4-inch brim around the entire circumference of the hat) are also  recommended for sun protection.  - Call for new or changing lesions.  History of Squamous Cell Carcinoma of the Skin - No evidence of recurrence today - No lymphadenopathy - Recommend regular full body skin exams - Recommend daily broad spectrum sunscreen SPF 30+ to sun-exposed areas, reapply every 2 hours as needed.  - Call if any new or changing lesions are noted between office visits  Acrochordons (Skin Tags) - Fleshy, skin-colored pedunculated papules - Benign appearing.  - Observe. - If desired, they can be removed with an in office procedure that is not covered by insurance. - Please call the clinic if you notice any new or changing lesions.  Skin cancer screening performed today.  Return in about 1 year (around 06/09/2022) for TBSE.  Luther Redo, CMA, am acting as scribe for Sarina Ser, MD . Documentation: I have reviewed the above documentation for accuracy and completeness, and I agree with the above.  Sarina Ser, MD

## 2021-06-09 NOTE — Patient Instructions (Signed)

## 2021-06-10 DIAGNOSIS — G4733 Obstructive sleep apnea (adult) (pediatric): Secondary | ICD-10-CM | POA: Diagnosis not present

## 2021-06-13 ENCOUNTER — Telehealth: Payer: Self-pay | Admitting: Urology

## 2021-06-13 ENCOUNTER — Encounter: Payer: Self-pay | Admitting: Dermatology

## 2021-06-13 NOTE — Telephone Encounter (Signed)
Patient called and stated that he has been taking Toviaz and it has gotten to be too expensive.  Patient stated that he has done some research and is wanting to know if Fesoterodine can be called in for him to try.  He stated he would like for the prescription to be  called in at [facility].  He would like a phone call back to let him know when the rx has been sent

## 2021-06-14 ENCOUNTER — Telehealth: Payer: Self-pay

## 2021-06-14 MED ORDER — TOVIAZ 8 MG PO TB24
8.0000 mg | ORAL_TABLET | Freq: Every day | ORAL | 2 refills | Status: DC
Start: 2021-06-14 — End: 2021-06-20

## 2021-06-14 NOTE — Telephone Encounter (Signed)
-----   Message from Ralene Bathe, MD sent at 06/13/2021  5:30 PM EST ----- Diagnosis Skin , right post deltoid DYSPLASTIC JUNCTIONAL NEVUS WITH SEVERE ATYPIA, PERIPHERAL MARGIN INVOLVED, SEE DESCRIPTION  Severe dysplastic Schedule surgery

## 2021-06-14 NOTE — Telephone Encounter (Signed)
Discussed biopsy with pt scheduled surgery appt

## 2021-06-14 NOTE — Telephone Encounter (Signed)
Left Vm RX sent to [facility]

## 2021-06-20 ENCOUNTER — Other Ambulatory Visit: Payer: Self-pay

## 2021-06-20 DIAGNOSIS — R350 Frequency of micturition: Secondary | ICD-10-CM

## 2021-06-20 DIAGNOSIS — N401 Enlarged prostate with lower urinary tract symptoms: Secondary | ICD-10-CM

## 2021-06-20 MED ORDER — FESOTERODINE FUMARATE ER 8 MG PO TB24: 8.0000 mg | ORAL_TABLET | Freq: Every day | ORAL | 2 refills | Status: DC

## 2021-06-20 NOTE — Telephone Encounter (Signed)
Incoming call on triage line from [place] requesting Lisbeth Ply be sent in as generic. New Rx sent.

## 2021-07-01 DIAGNOSIS — Z1159 Encounter for screening for other viral diseases: Secondary | ICD-10-CM | POA: Diagnosis not present

## 2021-07-01 DIAGNOSIS — E782 Mixed hyperlipidemia: Secondary | ICD-10-CM | POA: Diagnosis not present

## 2021-07-08 DIAGNOSIS — Z0000 Encounter for general adult medical examination without abnormal findings: Secondary | ICD-10-CM | POA: Diagnosis not present

## 2021-07-08 DIAGNOSIS — E781 Pure hyperglyceridemia: Secondary | ICD-10-CM | POA: Diagnosis not present

## 2021-07-08 DIAGNOSIS — Z1389 Encounter for screening for other disorder: Secondary | ICD-10-CM | POA: Diagnosis not present

## 2021-07-19 ENCOUNTER — Ambulatory Visit: Payer: Medicare HMO | Admitting: Dermatology

## 2021-07-19 ENCOUNTER — Other Ambulatory Visit: Payer: Self-pay

## 2021-07-19 ENCOUNTER — Encounter: Payer: Self-pay | Admitting: Dermatology

## 2021-07-19 DIAGNOSIS — D2239 Melanocytic nevi of other parts of face: Secondary | ICD-10-CM

## 2021-07-19 DIAGNOSIS — D2261 Melanocytic nevi of right upper limb, including shoulder: Secondary | ICD-10-CM

## 2021-07-19 DIAGNOSIS — D485 Neoplasm of uncertain behavior of skin: Secondary | ICD-10-CM

## 2021-07-19 DIAGNOSIS — L988 Other specified disorders of the skin and subcutaneous tissue: Secondary | ICD-10-CM | POA: Diagnosis not present

## 2021-07-19 MED ORDER — MUPIROCIN 2 % EX OINT: 1.0000 "application " | TOPICAL_OINTMENT | Freq: Every day | CUTANEOUS | 0 refills | Status: DC

## 2021-07-19 NOTE — Patient Instructions (Signed)
Wound Care Instructions  On the day following your surgery, you should begin doing daily dressing changes: Remove the old dressing and discard it. Cleanse the wound gently with tap water. This may be done in the shower or by placing a wet gauze pad directly on the wound and letting it soak for several minutes. It is important to gently remove any dried blood from the wound in order to encourage healing. This may be done by gently rolling a moistened Q-tip on the dried blood. Do not pick at the wound. If the wound should start to bleed, continue cleaning the wound, then place a moist gauze pad on the wound and hold pressure for a few minutes.  Make sure you then dry the skin surrounding the wound completely or the tape will not stick to the skin. Do not use cotton balls on the wound. After the wound is clean and dry, apply the ointment gently with a Q-tip. Cut a non-stick pad to fit the size of the wound. Lay the pad flush to the wound. If the wound is draining, you may want to reinforce it with a small amount of gauze on top of the non-stick pad for a little added compression to the area. Use the tape to seal the area completely. Select from the following with respect to your individual situation: If your wound has been stitched closed: continue the above steps 1-8 at least daily until your sutures are removed. If your wound has been left open to heal: continue steps 1-8 at least daily for the first 3-4 weeks. We would like for you to take a few extra precautions for at least the next week. Sleep with your head elevated on pillows if our wound is on your head. Do not bend over or lift heavy items to reduce the chance of elevated blood pressure to the wound Do not participate in particularly strenuous activities.   Below is a list of dressing supplies you might need.  Cotton-tipped applicators - Q-tips Gauze pads (2x2 and/or 4x4) - All-Purpose Sponges Non-stick dressing material - Telfa Tape -  Paper or Hypafix New and clean tube of petroleum jelly - Vaseline    Comments on Post-Operative Period Slight swelling and redness often appear around the wound. This is normal and will disappear within several days following the surgery. The healing wound will drain a brownish-red-yellow discharge during healing. This is a normal phase of wound healing. As the wound begins to heal, the drainage may increase in amount. Again, this drainage is normal. Notify us if the drainage becomes persistently bloody, excessively swollen, or intensely painful or develops a foul odor or red streaks.  If you should experience mild discomfort during the healing phase, you may take an aspirin-free medication such as Tylenol (acetaminophen). Notify us if the discomfort is severe or persistent. Avoid alcoholic beverages when taking pain medicine.  In Case of Wound Hemorrhage A wound hemorrhage is when the bandage suddenly becomes soaked with bright red blood and flows profusely. If this happens, sit down or lie down with your head elevated. If the wound has a dressing on it, do not remove the dressing. Apply pressure to the existing gauze. If the wound is not covered, use a gauze pad to apply pressure and continue applying the pressure for 20 minutes without peeking. DO NOT COVER THE WOUND WITH A LARGE TOWEL OR [place] CLOTH. Release your hand from the wound site but do not remove the dressing. If the bleeding has stopped,  gently clean around the wound. Leave the dressing in place for 24 hours if possible. This wait time allows the blood vessels to close off so that you do not spark a new round of bleeding by disrupting the newly clotted blood vessels with an immediate dressing change. If the bleeding does not subside, continue to hold pressure. If matters are out of your control, contact an After Hours clinic or go to the Emergency Room.    If You Need Anything After Your Visit  If you have any questions or concerns for  your doctor, please call our main line at [phone] and press option 4 to reach your doctor's medical assistant. If no one answers, please leave a voicemail as directed and we will return your call as soon as possible. Messages left after 4 pm will be answered the following business day.   You may also send [place] a message via [place]. We typically respond to MyChart messages within 1-2 business days.  For prescription refills, please ask your pharmacy to contact our office. Our fax number is [phone].  If you have an urgent issue when the clinic is closed that cannot wait until the next business day, you can page your doctor at the number below.    Please note that while we do our best to be available for urgent issues outside of office hours, we are not available 24/7.   If you have an urgent issue and are unable to reach [place], you may choose to seek medical care at your doctor's office, retail clinic, urgent care center, or emergency room.  If you have a medical emergency, please immediately call 911 or go to the emergency department.  Pager Numbers  - Dr. Nehemiah Massed: [phone]  - Dr. Laurence Ferrari: [phone]  - Dr. Nicole Kindred: [phone]  In the event of inclement weather, please call our main line at [phone] for an update on the status of any delays or closures.  Dermatology Medication Tips: Please keep the boxes that topical medications come in in order to help keep track of the instructions about where and how to use these. Pharmacies typically print the medication instructions only on the boxes and not directly on the medication tubes.   If your medication is too expensive, please contact our office at [phone] option 4 or send [place] a message through [place].   We are unable to tell what your co-pay for medications will be in advance as this is different depending on your insurance coverage. However, we may be able to find a substitute medication at lower cost or fill out  paperwork to get insurance to cover a needed medication.   If a prior authorization is required to get your medication covered by your insurance company, please allow [place] 1-2 business days to complete this process.  Drug prices often vary depending on where the prescription is filled and some pharmacies may offer cheaper prices.  The website www.goodrx.com contains coupons for medications through different pharmacies. The prices here do not account for what the cost may be with help from insurance (it may be cheaper with your insurance), but the website can give you the price if you did not use any insurance.  - You can print the associated coupon and take it with your prescription to the pharmacy.  - You may also stop by our office during regular business hours and pick up a GoodRx coupon card.  - If you need your prescription sent electronically to a different pharmacy, notify our  office through [facility] or by phone at [phone] option 4.     Si Usted Necesita Algo Despus de Su Visita  Tambin puede enviarnos un mensaje a travs de Pharmacist, community. Por lo general respondemos a los mensajes de MyChart en el transcurso de 1 a 2 das hbiles.  Para renovar recetas, por favor pida a su farmacia que se ponga en contacto con nuestra oficina. Harland Dingwall de fax es [place] [phone].  Si tiene un asunto urgente cuando la clnica est cerrada y que no puede esperar hasta el siguiente da hbil, puede llamar/localizar a su doctor(a) al nmero que aparece a continuacin.   Por favor, tenga en cuenta que aunque hacemos todo lo posible para estar disponibles para asuntos urgentes fuera del horario de [place], no estamos disponibles las 24 horas del da, los 7 das de la [place].   Si tiene un problema urgente y no puede comunicarse con nosotros, puede optar por buscar atencin mdica  en el consultorio de su doctor(a), en una clnica privada, en un centro de atencin urgente o en una sala de  emergencias.  Si tiene Engineering geologist, por favor llame inmediatamente al 911 o vaya a la sala de emergencias.  Nmeros de bper  - Dr. Nehemiah Massed: [phone]  - Dra. Moye: [phone]  - Dra. Nicole Kindred: [phone]  En caso de inclemencias del [place], por favor llame a Johnsie Kindred principal al [phone] para una actualizacin sobre el [place] de cualquier retraso o cierre.  Consejos para la medicacin en dermatologa: Por favor, guarde las cajas en las que vienen los medicamentos de uso tpico para ayudarle a seguir las instrucciones sobre dnde y cmo usarlos. Las farmacias generalmente imprimen las instrucciones del medicamento slo en las cajas y no directamente en los tubos del [place].   Si su medicamento es muy caro, por favor, pngase en contacto con Zigmund Daniel llamando al [phone] y presione la opcin 4 o envenos un mensaje a travs de Pharmacist, community.   No podemos decirle cul ser su copago por los medicamentos por adelantado ya que esto es diferente dependiendo de la cobertura de su seguro. Sin embargo, es posible que podamos encontrar un medicamento sustituto a Electrical engineer un formulario para que el seguro cubra el medicamento que se considera necesario.   Si se requiere una autorizacin previa para que su compaa de seguros [place] su medicamento, por favor permtanos de 1 a 2 das hbiles para completar este proceso.  Los precios de los medicamentos varan con frecuencia dependiendo del Environmental consultant de dnde se surte la receta y alguna farmacias pueden ofrecer precios ms baratos.  El sitio web www.goodrx.com tiene cupones para medicamentos de Airline pilot. Los precios aqu no tienen en cuenta lo que podra costar con la ayuda del seguro (puede ser ms barato con su seguro), pero el sitio web puede darle el precio si no utiliz Research scientist (physical sciences).  - Puede imprimir el cupn correspondiente y llevarlo con su receta a la farmacia.  - Tambin puede pasar por  nuestra oficina durante el horario de atencin regular y Charity fundraiser una tarjeta de cupones de GoodRx.  - Si necesita que su receta se enve electrnicamente a una farmacia diferente, informe a nuestra oficina a travs de MyChart de [place] o por telfono llamando al [phone] y presione la opcin 4.

## 2021-07-19 NOTE — Progress Notes (Signed)
? ?  Follow-Up Visit ?  ?Subjective  ?Patrick Shepherd is a 76 y.o. 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 ?The patient has spots, moles and lesions to be evaluated, some may be new or changing and the patient has concerns that these could be cancer. ? ?The following portions of the chart were reviewed this encounter and updated as appropriate:  ? Tobacco  Allergies  Meds  Problems  Med Hx  Surg Hx  Fam Hx   ?  ?Review of Systems:  No other skin or systemic complaints except as noted in HPI or Assessment and Plan. ? ?Objective  ?Well appearing patient in no apparent distress; mood and affect are within normal limits. ? ?A focused examination was performed including face, right arm. Relevant physical exam findings are noted in the Assessment and Plan. ? ?Right post deltoid ?Healing biopsy site of right post deltoid ? ?Right Buccal Cheek ?Scaly pink papule of right cheek ? ? ?Assessment & Plan  ?Neoplasm of uncertain behavior of skin (2) ?Right post deltoid ? ?Skin excision ? ?Lesion length (cm):  1.1 ?Lesion width (cm):  0.7 ?Margin per side (cm):  0.2 ?Total excision diameter (cm):  1.5 ?Informed consent: discussed and consent obtained   ?Timeout: patient name, date of birth, surgical site, and procedure verified   ?Procedure prep:  Patient was prepped and draped in usual sterile fashion ?Prep type:  Isopropyl alcohol and povidone-iodine ?Anesthesia: the lesion was anesthetized in a standard fashion   ?Anesthetic:  1% lidocaine w/ epinephrine 1-100,000 buffered w/ 8.4% NaHCO3 ?Instrument used: #15 blade   ?Hemostasis achieved with: pressure   ?Hemostasis achieved with comment:  Electrocautery ?Outcome: patient tolerated procedure well with no complications   ?Post-procedure details: sterile dressing applied and wound care instructions given   ?Dressing type: bandage and pressure dressing  (mupirocin)   ? ?Skin repair ?Complexity:  Complex ?Final length (cm):  5 ?Reason for type of repair: reduce tension to allow closure, reduce the risk of dehiscence, infection, and necrosis, reduce subcutaneous dead space and avoid a hematoma, allow closure of the large defect, preserve normal anatomy, preserve normal anatomical and functional relationships and enhance both functionality and cosmetic results   ?Undermining: area extensively undermined   ?Undermining comment:  Undermining defect 1.1 cm ?Subcutaneous layers (deep stitches):  ?Suture size:  2-0 ?Suture type: Vicryl (polyglactin 910)   ?Subcutaneous suture technique: inverted dermal. ?Fine/surface layer approximation (top stitches):  ?Suture size:  2-0 ?Suture type: nylon   ?Stitches: simple running   ?Suture removal (days):  7 ?Hemostasis achieved with: suture and pressure ?Outcome: patient tolerated procedure well with no complications   ?Post-procedure details: sterile dressing applied and wound care instructions given   ?Dressing type: bandage and pressure dressing (mupirocin)   ? ?mupirocin ointment (BACTROBAN) 2 % ?Apply 1 application. topically daily. With dressing changes ? ?Specimen 1 - Surgical pathology ?Differential Diagnosis: Biopsy proven severe dysplastic nevus ?Check Margins: No ?[id number] ? ?Right Buccal Cheek ? ?Will plan shave removal and EDC to lesion of right cheek at post op. ? ? ?Return in about 1 week (around 07/26/2021) for suture removal. ? ?I, Ashok Cordia, CMA, am acting as scribe for Sarina Ser, MD . ?Documentation: I have reviewed the above documentation for accuracy and completeness, and I agree with the above. ? ?Sarina Ser, MD ? ?

## 2021-07-20 ENCOUNTER — Telehealth: Payer: Self-pay

## 2021-07-20 NOTE — Telephone Encounter (Signed)
Spoke with patient regarding surgery. He is doing fine/hd  

## 2021-07-24 ENCOUNTER — Encounter: Payer: Self-pay | Admitting: Dermatology

## 2021-07-26 ENCOUNTER — Other Ambulatory Visit: Payer: Self-pay

## 2021-07-26 ENCOUNTER — Ambulatory Visit (INDEPENDENT_AMBULATORY_CARE_PROVIDER_SITE_OTHER): Payer: Medicare HMO | Admitting: Dermatology

## 2021-07-26 DIAGNOSIS — L820 Inflamed seborrheic keratosis: Secondary | ICD-10-CM | POA: Diagnosis not present

## 2021-07-26 DIAGNOSIS — D492 Neoplasm of unspecified behavior of bone, soft tissue, and skin: Secondary | ICD-10-CM

## 2021-07-26 DIAGNOSIS — C44329 Squamous cell carcinoma of skin of other parts of face: Secondary | ICD-10-CM | POA: Diagnosis not present

## 2021-07-26 DIAGNOSIS — C44320 Squamous cell carcinoma of skin of unspecified parts of face: Secondary | ICD-10-CM

## 2021-07-26 DIAGNOSIS — L905 Scar conditions and fibrosis of skin: Secondary | ICD-10-CM

## 2021-07-26 DIAGNOSIS — I872 Venous insufficiency (chronic) (peripheral): Secondary | ICD-10-CM

## 2021-07-26 NOTE — Patient Instructions (Addendum)
Cryotherapy Aftercare  Wash gently with soap and water everyday.   Apply Vaseline and Band-Aid daily until healed.    Wound Care Instructions  Cleanse wound gently with soap and water once a day then pat dry with clean gauze. Apply a thing coat of Petrolatum (petroleum jelly, "Vaseline") over the wound (unless you have an allergy to this). We recommend that you use a new, sterile tube of Vaseline. Do not pick or remove scabs. Do not remove the yellow or white "healing tissue" from the base of the wound.  Cover the wound with fresh, clean, nonstick gauze and secure with paper tape. You may use Band-Aids in place of gauze and tape if the would is small enough, but would recommend trimming much of the tape off as there is often too much. Sometimes Band-Aids can irritate the skin.  You should call the office for your biopsy report after 1 week if you have not already been contacted.  If you experience any problems, such as abnormal amounts of bleeding, swelling, significant bruising, significant pain, or evidence of infection, please call the office immediately.  FOR ADULT SURGERY PATIENTS: If you need something for pain relief you may take 1 extra strength Tylenol (acetaminophen) AND 2 Ibuprofen (200mg each) together every 4 hours as needed for pain. (do not take these if you are allergic to them or if you have a reason you should not take them.) Typically, you may only need pain medication for 1 to 3 days.        If You Need Anything After Your Visit  If you have any questions or concerns for your doctor, please call our main line at [phone] and press option 4 to reach your doctor's medical assistant. If no one answers, please leave a voicemail as directed and we will return your call as soon as possible. Messages left after 4 pm will be answered the following business day.   You may also send us a message via MyChart. We typically respond to MyChart messages within 1-2 business  days.  For prescription refills, please ask your pharmacy to contact our office. Our fax number is [phone].  If you have an urgent issue when the clinic is closed that cannot wait until the next business day, you can page your doctor at the number below.    Please note that while we do our best to be available for urgent issues outside of office hours, we are not available 24/7.   If you have an urgent issue and are unable to reach us, you may choose to seek medical care at your doctor's office, retail clinic, urgent care center, or emergency room.  If you have a medical emergency, please immediately call 911 or go to the emergency department.  Pager Numbers  - Dr. Kowalski: [phone]  - Dr. Moye: [phone]  - Dr. Stewart: [phone]  In the event of inclement weather, please call our main line at [phone] for an update on the status of any delays or closures.  Dermatology Medication Tips: Please keep the boxes that topical medications come in in order to help keep track of the instructions about where and how to use these. Pharmacies typically print the medication instructions only on the boxes and not directly on the medication tubes.   If your medication is too expensive, please contact our office at [phone] option 4 or send us a message through MyChart.   We are unable to tell what your co-pay for medications will be   in advance as this is different depending on your insurance coverage. However, we may be able to find a substitute medication at lower cost or fill out paperwork to get insurance to cover a needed medication.   If a prior authorization is required to get your medication covered by your insurance company, please allow us 1-2 business days to complete this process.  Drug prices often vary depending on where the prescription is filled and some pharmacies may offer cheaper prices.  The website www.goodrx.com contains coupons for medications through  different pharmacies. The prices here do not account for what the cost may be with help from insurance (it may be cheaper with your insurance), but the website can give you the price if you did not use any insurance.  - You can print the associated coupon and take it with your prescription to the pharmacy.  - You may also stop by our office during regular business hours and pick up a GoodRx coupon card.  - If you need your prescription sent electronically to a different pharmacy, notify our office through [place] MyChart or by phone at [phone] option 4.     Si Usted Necesita Algo Despus de Su Visita  Tambin puede enviarnos un mensaje a travs de MyChart. Por lo general respondemos a los mensajes de MyChart en el transcurso de 1 a 2 das hbiles.  Para renovar recetas, por favor pida a su farmacia que se ponga en contacto con nuestra oficina. Nuestro nmero de fax es el [phone].  Si tiene un asunto urgente cuando la clnica est cerrada y que no puede esperar hasta el siguiente da hbil, puede llamar/localizar a su doctor(a) al nmero que aparece a continuacin.   Por favor, tenga en cuenta que aunque hacemos todo lo posible para estar disponibles para asuntos urgentes fuera del horario de oficina, no estamos disponibles las 24 horas del da, los 7 das de la semana.   Si tiene un problema urgente y no puede comunicarse con nosotros, puede optar por buscar atencin mdica  en el consultorio de su doctor(a), en una clnica privada, en un centro de atencin urgente o en una sala de emergencias.  Si tiene una emergencia mdica, por favor llame inmediatamente al 911 o vaya a la sala de emergencias.  Nmeros de bper  - Dr. Kowalski: [phone]  - Dra. Moye: [phone]  - Dra. Stewart: [phone]  En caso de inclemencias del tiempo, por favor llame a nuestra lnea principal al [phone] para una actualizacin sobre el estado de cualquier retraso o cierre.  Consejos  para la medicacin en dermatologa: Por favor, guarde las cajas en las que vienen los medicamentos de uso tpico para ayudarle a seguir las instrucciones sobre dnde y cmo usarlos. Las farmacias generalmente imprimen las instrucciones del medicamento slo en las cajas y no directamente en los tubos del medicamento.   Si su medicamento es muy caro, por favor, pngase en contacto con nuestra oficina llamando al [phone] y presione la opcin 4 o envenos un mensaje a travs de MyChart.   No podemos decirle cul ser su copago por los medicamentos por adelantado ya que esto es diferente dependiendo de la cobertura de su seguro. Sin embargo, es posible que podamos encontrar un medicamento sustituto a menor costo o llenar un formulario para que el seguro cubra el medicamento que se considera necesario.   Si se requiere una autorizacin previa para que su compaa de seguros cubra su medicamento, por favor permtanos de 1 a   2 das hbiles para completar este proceso.  Los precios de los medicamentos varan con frecuencia dependiendo del lugar de dnde se surte la receta y alguna farmacias pueden ofrecer precios ms baratos.  El sitio web www.goodrx.com tiene cupones para medicamentos de diferentes farmacias. Los precios aqu no tienen en cuenta lo que podra costar con la ayuda del seguro (puede ser ms barato con su seguro), pero el sitio web puede darle el precio si no utiliz ningn seguro.  - Puede imprimir el cupn correspondiente y llevarlo con su receta a la farmacia.  - Tambin puede pasar por nuestra oficina durante el horario de atencin regular y recoger una tarjeta de cupones de GoodRx.  - Si necesita que su receta se enve electrnicamente a una farmacia diferente, informe a nuestra oficina a travs de MyChart de [place] o por telfono llamando al [phone] y presione la opcin 4.  

## 2021-07-26 NOTE — Progress Notes (Signed)
? ?Follow-Up Visit ?  ?Subjective  ?Patrick Shepherd is a 76 y.o. male who presents for the following: Suture / Staple Removal (1 week f/u suture removal right post deltoid/EXCISION, SCATTERED ATYPICAL MELANOCYTES, DIAGNOSTIC NEVUS Margins free). ?Pt c/o of spots growing and changing on his right cheek and left ankle. The patient has spots, moles and lesions to be evaluated, some may be new or changing and the patient has concerns that these could be cancer. ? ? ?The following portions of the chart were reviewed this encounter and updated as appropriate:  ? Tobacco  Allergies  Meds  Problems  Med Hx  Surg Hx  Fam Hx   ?  ?Review of Systems:  No other skin or systemic complaints except as noted in HPI or Assessment and Plan. ? ?Objective  ?Well appearing patient in no apparent distress; mood and affect are within normal limits. ? ?A focused examination was performed including right leg. Relevant physical exam findings are noted in the Assessment and Plan. ? ?right buccal cheek ?1.2 cm pink crusted papule  ? ? ? ? ? ? ?right post deltoid ?Healing scar  ? ?left ankle x 1 ?Stuck-on, waxy, tan-brown papule  ? ?lower legs ?Stasis changes  ? ? ?Assessment & Plan  ?Neoplasm of skin ?right buccal cheek ? ?Epidermal / dermal shaving ? ?Lesion diameter (cm):  1.2 ?Informed consent: discussed and consent obtained   ?Timeout: patient name, date of birth, surgical site, and procedure verified   ?Procedure prep:  Patient was prepped and draped in usual sterile fashion ?Prep type:  Isopropyl alcohol ?Anesthesia: the lesion was anesthetized in a standard fashion   ?Anesthetic:  1% lidocaine w/ epinephrine 1-100,000 buffered w/ 8.4% NaHCO3 ?Hemostasis achieved with: pressure, aluminum chloride and electrodesiccation   ?Outcome: patient tolerated procedure well   ?Post-procedure details: sterile dressing applied and wound care instructions given   ?Dressing type: bandage and petrolatum   ? ?Destruction of  lesion ? ?Destruction method: electrodesiccation and curettage   ?Informed consent: discussed and consent obtained   ?Timeout:  patient name, date of birth, surgical site, and procedure verified ?Anesthesia: the lesion was anesthetized in a standard fashion   ?Anesthetic:  1% lidocaine w/ epinephrine 1-100,000 buffered w/ 8.4% NaHCO3 ?Curettage performed in three different directions: Yes   ?Electrodesiccation performed over the curetted area: Yes   ?Curettage cycles:  3 ?Lesion length (cm):  1.2 ?Lesion width (cm):  1.2 ?Margin per side (cm):  0.2 ?Final wound size (cm):  1.6 ?Hemostasis achieved with:  electrodesiccation ?Outcome: patient tolerated procedure well with no complications   ?Post-procedure details: sterile dressing applied and wound care instructions given   ?Dressing type: petrolatum   ? ?Specimen 1 - Surgical pathology ?Differential Diagnosis: R/O SCC ? ?Check Margins: No ? ?Scar ?right post deltoid ?Biopsy results discussed  ? ?Encounter for Removal of Sutures ?- Incision site at the right post deltoid is clean, dry and intact ?- Wound cleansed, sutures removed, wound cleansed and steri strips applied.  ?- Discussed pathology results showing SCATTERED ATYPICAL MELANOCYTES, DIAGNOSTIC NEVUS Margins free  ?- Patient advised to keep steri-strips dry until they fall off. ?- Scars remodel for a full year. ?- Once steri-strips fall off, patient can apply over-the-counter silicone scar cream each night to help with scar remodeling if desired. ?- Patient advised to call with any concerns or if they notice any new or changing lesions.  ? ?Inflamed seborrheic keratosis ?left ankle x 1 ?Reassured benign age-related growth.  Recommend observation.  Discussed cryotherapy if spot(s) become irritated or inflamed.  ? ?Destruction of lesion - left ankle x 1 ? ?Destruction method: cryotherapy   ?Informed consent: discussed and consent obtained   ?Lesion destroyed using liquid nitrogen: Yes   ?Region frozen until ice  ball extended beyond lesion: Yes   ?Outcome: patient tolerated procedure well with no complications   ?Post-procedure details: wound care instructions given   ? ?Venous stasis dermatitis of left lower extremity ?lower legs ?Stasis in the legs causes chronic leg swelling, which may result in itchy or painful rashes, skin discoloration, skin texture changes, and sometimes ulceration.  Recommend daily graduated compression hose/stockings- easiest to put on first thing in morning, remove at bedtime.  Elevate legs as much as possible. Avoid salt/sodium rich foods.  ? ?Return for as scheduled. ? ?I, Marye Round, CMA, am acting as scribe for Sarina Ser, MD .  ?Documentation: I have reviewed the above documentation for accuracy and completeness, and I agree with the above. ? ?Sarina Ser, MD ? ?

## 2021-08-01 ENCOUNTER — Encounter: Payer: Self-pay | Admitting: Dermatology

## 2021-08-02 ENCOUNTER — Telehealth: Payer: Self-pay

## 2021-08-02 NOTE — Telephone Encounter (Signed)
-----   Message from Ralene Bathe, MD sent at 08/01/2021  3:41 PM EDT ----- ?Diagnosis ?Skin , right buccal cheek ?WELL DIFFERENTIATED SQUAMOUS CELL CARCINOMA, ACANTHOLYTIC (ADENOID) VARIANT, BASE INVOLVED ? ?Cancer - SCC ?Already treated ?Recheck next visit 06/14/2022. ?If any recurrence, call office for appt ?

## 2021-08-02 NOTE — Telephone Encounter (Signed)
Biopsy results discussed with pt, if any sign of recurrence return to the office before annual appt scheduled for Jan 2024 ?

## 2021-09-05 ENCOUNTER — Other Ambulatory Visit: Payer: Self-pay | Admitting: Urology

## 2021-09-05 DIAGNOSIS — N401 Enlarged prostate with lower urinary tract symptoms: Secondary | ICD-10-CM

## 2021-12-07 DIAGNOSIS — L0390 Cellulitis, unspecified: Secondary | ICD-10-CM | POA: Diagnosis not present

## 2021-12-07 DIAGNOSIS — H60541 Acute eczematoid otitis externa, right ear: Secondary | ICD-10-CM | POA: Diagnosis not present

## 2021-12-12 DIAGNOSIS — H60541 Acute eczematoid otitis externa, right ear: Secondary | ICD-10-CM | POA: Diagnosis not present

## 2021-12-21 DIAGNOSIS — H26492 Other secondary cataract, left eye: Secondary | ICD-10-CM | POA: Diagnosis not present

## 2021-12-21 DIAGNOSIS — H524 Presbyopia: Secondary | ICD-10-CM | POA: Diagnosis not present

## 2021-12-26 ENCOUNTER — Other Ambulatory Visit: Payer: Self-pay | Admitting: Unknown Physician Specialty

## 2021-12-26 DIAGNOSIS — H903 Sensorineural hearing loss, bilateral: Secondary | ICD-10-CM | POA: Diagnosis not present

## 2021-12-26 DIAGNOSIS — H60541 Acute eczematoid otitis externa, right ear: Secondary | ICD-10-CM | POA: Diagnosis not present

## 2021-12-26 DIAGNOSIS — H9211 Otorrhea, right ear: Secondary | ICD-10-CM

## 2022-01-02 DIAGNOSIS — E782 Mixed hyperlipidemia: Secondary | ICD-10-CM | POA: Diagnosis not present

## 2022-01-04 ENCOUNTER — Ambulatory Visit
Admission: RE | Admit: 2022-01-04 | Discharge: 2022-01-04 | Disposition: A | Discharge status: Home / Self Care | Payer: Medicare HMO | Source: Ambulatory Visit | Attending: Unknown Physician Specialty | Admitting: Unknown Physician Specialty

## 2022-01-04 DIAGNOSIS — H61891 Other specified disorders of right external ear: Secondary | ICD-10-CM | POA: Diagnosis not present

## 2022-01-04 DIAGNOSIS — H73891 Other specified disorders of tympanic membrane, right ear: Secondary | ICD-10-CM | POA: Diagnosis not present

## 2022-01-04 DIAGNOSIS — H9211 Otorrhea, right ear: Secondary | ICD-10-CM

## 2022-01-09 DIAGNOSIS — Z9989 Dependence on other enabling machines and devices: Secondary | ICD-10-CM | POA: Diagnosis not present

## 2022-01-09 DIAGNOSIS — E782 Mixed hyperlipidemia: Secondary | ICD-10-CM | POA: Diagnosis not present

## 2022-01-09 DIAGNOSIS — K219 Gastro-esophageal reflux disease without esophagitis: Secondary | ICD-10-CM | POA: Diagnosis not present

## 2022-01-09 DIAGNOSIS — G4733 Obstructive sleep apnea (adult) (pediatric): Secondary | ICD-10-CM | POA: Diagnosis not present

## 2022-01-09 DIAGNOSIS — H6021 Malignant otitis externa, right ear: Secondary | ICD-10-CM | POA: Diagnosis not present

## 2022-01-09 DIAGNOSIS — H938X1 Other specified disorders of right ear: Secondary | ICD-10-CM | POA: Diagnosis not present

## 2022-01-17 DIAGNOSIS — C410 Malignant neoplasm of bones of skull and face: Secondary | ICD-10-CM | POA: Diagnosis not present

## 2022-01-18 DIAGNOSIS — C410 Malignant neoplasm of bones of skull and face: Secondary | ICD-10-CM | POA: Diagnosis not present

## 2022-01-18 DIAGNOSIS — C760 Malignant neoplasm of head, face and neck: Secondary | ICD-10-CM | POA: Diagnosis not present

## 2022-01-19 DIAGNOSIS — C410 Malignant neoplasm of bones of skull and face: Secondary | ICD-10-CM | POA: Diagnosis not present

## 2022-01-19 DIAGNOSIS — C760 Malignant neoplasm of head, face and neck: Secondary | ICD-10-CM | POA: Diagnosis not present

## 2022-01-26 DIAGNOSIS — C410 Malignant neoplasm of bones of skull and face: Secondary | ICD-10-CM | POA: Diagnosis not present

## 2022-01-26 DIAGNOSIS — T1592XA Foreign body on external eye, part unspecified, left eye, initial encounter: Secondary | ICD-10-CM | POA: Diagnosis not present

## 2022-01-26 DIAGNOSIS — Z87891 Personal history of nicotine dependence: Secondary | ICD-10-CM | POA: Diagnosis not present

## 2022-01-26 DIAGNOSIS — C44222 Squamous cell carcinoma of skin of right ear and external auricular canal: Secondary | ICD-10-CM | POA: Diagnosis not present

## 2022-02-01 ENCOUNTER — Encounter: Payer: Self-pay | Admitting: Urology

## 2022-02-03 DIAGNOSIS — Z09 Encounter for follow-up examination after completed treatment for conditions other than malignant neoplasm: Secondary | ICD-10-CM | POA: Diagnosis not present

## 2022-02-03 DIAGNOSIS — C410 Malignant neoplasm of bones of skull and face: Secondary | ICD-10-CM | POA: Diagnosis not present

## 2022-02-09 DIAGNOSIS — C44222 Squamous cell carcinoma of skin of right ear and external auricular canal: Secondary | ICD-10-CM | POA: Diagnosis not present

## 2022-02-28 DIAGNOSIS — C44222 Squamous cell carcinoma of skin of right ear and external auricular canal: Secondary | ICD-10-CM | POA: Diagnosis not present

## 2022-02-28 DIAGNOSIS — Z510 Encounter for antineoplastic radiation therapy: Secondary | ICD-10-CM | POA: Diagnosis not present

## 2022-02-28 DIAGNOSIS — R29810 Facial weakness: Secondary | ICD-10-CM | POA: Diagnosis not present

## 2022-02-28 DIAGNOSIS — R42 Dizziness and giddiness: Secondary | ICD-10-CM | POA: Diagnosis not present

## 2022-03-01 DIAGNOSIS — C44222 Squamous cell carcinoma of skin of right ear and external auricular canal: Secondary | ICD-10-CM | POA: Insufficient documentation

## 2022-03-03 DIAGNOSIS — C44222 Squamous cell carcinoma of skin of right ear and external auricular canal: Secondary | ICD-10-CM | POA: Diagnosis not present

## 2022-03-03 DIAGNOSIS — Z510 Encounter for antineoplastic radiation therapy: Secondary | ICD-10-CM | POA: Diagnosis not present

## 2022-03-03 DIAGNOSIS — R42 Dizziness and giddiness: Secondary | ICD-10-CM | POA: Diagnosis not present

## 2022-03-03 DIAGNOSIS — R29810 Facial weakness: Secondary | ICD-10-CM | POA: Diagnosis not present

## 2022-03-09 DIAGNOSIS — Z510 Encounter for antineoplastic radiation therapy: Secondary | ICD-10-CM | POA: Diagnosis not present

## 2022-03-09 DIAGNOSIS — R42 Dizziness and giddiness: Secondary | ICD-10-CM | POA: Diagnosis not present

## 2022-03-09 DIAGNOSIS — C44222 Squamous cell carcinoma of skin of right ear and external auricular canal: Secondary | ICD-10-CM | POA: Diagnosis not present

## 2022-03-09 DIAGNOSIS — R29810 Facial weakness: Secondary | ICD-10-CM | POA: Diagnosis not present

## 2022-03-10 DIAGNOSIS — C410 Malignant neoplasm of bones of skull and face: Secondary | ICD-10-CM | POA: Diagnosis not present

## 2022-03-10 DIAGNOSIS — C44222 Squamous cell carcinoma of skin of right ear and external auricular canal: Secondary | ICD-10-CM | POA: Diagnosis not present

## 2022-03-10 DIAGNOSIS — R42 Dizziness and giddiness: Secondary | ICD-10-CM | POA: Diagnosis not present

## 2022-03-10 DIAGNOSIS — R29810 Facial weakness: Secondary | ICD-10-CM | POA: Diagnosis not present

## 2022-03-10 DIAGNOSIS — Z510 Encounter for antineoplastic radiation therapy: Secondary | ICD-10-CM | POA: Diagnosis not present

## 2022-03-10 DIAGNOSIS — Z09 Encounter for follow-up examination after completed treatment for conditions other than malignant neoplasm: Secondary | ICD-10-CM | POA: Diagnosis not present

## 2022-03-13 DIAGNOSIS — R29810 Facial weakness: Secondary | ICD-10-CM | POA: Diagnosis not present

## 2022-03-13 DIAGNOSIS — R42 Dizziness and giddiness: Secondary | ICD-10-CM | POA: Diagnosis not present

## 2022-03-13 DIAGNOSIS — C44222 Squamous cell carcinoma of skin of right ear and external auricular canal: Secondary | ICD-10-CM | POA: Diagnosis not present

## 2022-03-13 DIAGNOSIS — Z510 Encounter for antineoplastic radiation therapy: Secondary | ICD-10-CM | POA: Diagnosis not present

## 2022-03-14 DIAGNOSIS — Z510 Encounter for antineoplastic radiation therapy: Secondary | ICD-10-CM | POA: Diagnosis not present

## 2022-03-14 DIAGNOSIS — R29810 Facial weakness: Secondary | ICD-10-CM | POA: Diagnosis not present

## 2022-03-14 DIAGNOSIS — R42 Dizziness and giddiness: Secondary | ICD-10-CM | POA: Diagnosis not present

## 2022-03-14 DIAGNOSIS — C44222 Squamous cell carcinoma of skin of right ear and external auricular canal: Secondary | ICD-10-CM | POA: Diagnosis not present

## 2022-03-15 DIAGNOSIS — Z510 Encounter for antineoplastic radiation therapy: Secondary | ICD-10-CM | POA: Diagnosis not present

## 2022-03-15 DIAGNOSIS — Z87891 Personal history of nicotine dependence: Secondary | ICD-10-CM | POA: Diagnosis not present

## 2022-03-15 DIAGNOSIS — K117 Disturbances of salivary secretion: Secondary | ICD-10-CM | POA: Diagnosis not present

## 2022-03-15 DIAGNOSIS — K5732 Diverticulitis of large intestine without perforation or abscess without bleeding: Secondary | ICD-10-CM | POA: Diagnosis not present

## 2022-03-15 DIAGNOSIS — C7951 Secondary malignant neoplasm of bone: Secondary | ICD-10-CM | POA: Diagnosis not present

## 2022-03-15 DIAGNOSIS — R439 Unspecified disturbances of smell and taste: Secondary | ICD-10-CM | POA: Diagnosis not present

## 2022-03-15 DIAGNOSIS — Z713 Dietary counseling and surveillance: Secondary | ICD-10-CM | POA: Diagnosis not present

## 2022-03-15 DIAGNOSIS — L659 Nonscarring hair loss, unspecified: Secondary | ICD-10-CM | POA: Diagnosis not present

## 2022-03-15 DIAGNOSIS — C44222 Squamous cell carcinoma of skin of right ear and external auricular canal: Secondary | ICD-10-CM | POA: Diagnosis not present

## 2022-03-16 DIAGNOSIS — K5732 Diverticulitis of large intestine without perforation or abscess without bleeding: Secondary | ICD-10-CM | POA: Diagnosis not present

## 2022-03-16 DIAGNOSIS — R439 Unspecified disturbances of smell and taste: Secondary | ICD-10-CM | POA: Diagnosis not present

## 2022-03-16 DIAGNOSIS — K117 Disturbances of salivary secretion: Secondary | ICD-10-CM | POA: Diagnosis not present

## 2022-03-16 DIAGNOSIS — C7951 Secondary malignant neoplasm of bone: Secondary | ICD-10-CM | POA: Diagnosis not present

## 2022-03-16 DIAGNOSIS — L659 Nonscarring hair loss, unspecified: Secondary | ICD-10-CM | POA: Diagnosis not present

## 2022-03-16 DIAGNOSIS — Z510 Encounter for antineoplastic radiation therapy: Secondary | ICD-10-CM | POA: Diagnosis not present

## 2022-03-16 DIAGNOSIS — Z87891 Personal history of nicotine dependence: Secondary | ICD-10-CM | POA: Diagnosis not present

## 2022-03-16 DIAGNOSIS — C44222 Squamous cell carcinoma of skin of right ear and external auricular canal: Secondary | ICD-10-CM | POA: Diagnosis not present

## 2022-03-16 DIAGNOSIS — Z713 Dietary counseling and surveillance: Secondary | ICD-10-CM | POA: Diagnosis not present

## 2022-03-17 DIAGNOSIS — C7951 Secondary malignant neoplasm of bone: Secondary | ICD-10-CM | POA: Diagnosis not present

## 2022-03-17 DIAGNOSIS — K5732 Diverticulitis of large intestine without perforation or abscess without bleeding: Secondary | ICD-10-CM | POA: Diagnosis not present

## 2022-03-17 DIAGNOSIS — Z713 Dietary counseling and surveillance: Secondary | ICD-10-CM | POA: Diagnosis not present

## 2022-03-17 DIAGNOSIS — Z510 Encounter for antineoplastic radiation therapy: Secondary | ICD-10-CM | POA: Diagnosis not present

## 2022-03-17 DIAGNOSIS — R439 Unspecified disturbances of smell and taste: Secondary | ICD-10-CM | POA: Diagnosis not present

## 2022-03-17 DIAGNOSIS — Z87891 Personal history of nicotine dependence: Secondary | ICD-10-CM | POA: Diagnosis not present

## 2022-03-17 DIAGNOSIS — L659 Nonscarring hair loss, unspecified: Secondary | ICD-10-CM | POA: Diagnosis not present

## 2022-03-17 DIAGNOSIS — K117 Disturbances of salivary secretion: Secondary | ICD-10-CM | POA: Diagnosis not present

## 2022-03-17 DIAGNOSIS — C44222 Squamous cell carcinoma of skin of right ear and external auricular canal: Secondary | ICD-10-CM | POA: Diagnosis not present

## 2022-03-20 DIAGNOSIS — Z87891 Personal history of nicotine dependence: Secondary | ICD-10-CM | POA: Diagnosis not present

## 2022-03-20 DIAGNOSIS — L659 Nonscarring hair loss, unspecified: Secondary | ICD-10-CM | POA: Diagnosis not present

## 2022-03-20 DIAGNOSIS — Z510 Encounter for antineoplastic radiation therapy: Secondary | ICD-10-CM | POA: Diagnosis not present

## 2022-03-20 DIAGNOSIS — K117 Disturbances of salivary secretion: Secondary | ICD-10-CM | POA: Diagnosis not present

## 2022-03-20 DIAGNOSIS — Z713 Dietary counseling and surveillance: Secondary | ICD-10-CM | POA: Diagnosis not present

## 2022-03-20 DIAGNOSIS — C7951 Secondary malignant neoplasm of bone: Secondary | ICD-10-CM | POA: Diagnosis not present

## 2022-03-20 DIAGNOSIS — K5732 Diverticulitis of large intestine without perforation or abscess without bleeding: Secondary | ICD-10-CM | POA: Diagnosis not present

## 2022-03-20 DIAGNOSIS — C44222 Squamous cell carcinoma of skin of right ear and external auricular canal: Secondary | ICD-10-CM | POA: Diagnosis not present

## 2022-03-20 DIAGNOSIS — R439 Unspecified disturbances of smell and taste: Secondary | ICD-10-CM | POA: Diagnosis not present

## 2022-03-21 DIAGNOSIS — R439 Unspecified disturbances of smell and taste: Secondary | ICD-10-CM | POA: Diagnosis not present

## 2022-03-21 DIAGNOSIS — Z87891 Personal history of nicotine dependence: Secondary | ICD-10-CM | POA: Diagnosis not present

## 2022-03-21 DIAGNOSIS — L659 Nonscarring hair loss, unspecified: Secondary | ICD-10-CM | POA: Diagnosis not present

## 2022-03-21 DIAGNOSIS — K117 Disturbances of salivary secretion: Secondary | ICD-10-CM | POA: Diagnosis not present

## 2022-03-21 DIAGNOSIS — C44222 Squamous cell carcinoma of skin of right ear and external auricular canal: Secondary | ICD-10-CM | POA: Diagnosis not present

## 2022-03-21 DIAGNOSIS — C7951 Secondary malignant neoplasm of bone: Secondary | ICD-10-CM | POA: Diagnosis not present

## 2022-03-21 DIAGNOSIS — K5732 Diverticulitis of large intestine without perforation or abscess without bleeding: Secondary | ICD-10-CM | POA: Diagnosis not present

## 2022-03-21 DIAGNOSIS — Z510 Encounter for antineoplastic radiation therapy: Secondary | ICD-10-CM | POA: Diagnosis not present

## 2022-03-21 DIAGNOSIS — Z713 Dietary counseling and surveillance: Secondary | ICD-10-CM | POA: Diagnosis not present

## 2022-03-22 DIAGNOSIS — C7951 Secondary malignant neoplasm of bone: Secondary | ICD-10-CM | POA: Diagnosis not present

## 2022-03-22 DIAGNOSIS — Z87891 Personal history of nicotine dependence: Secondary | ICD-10-CM | POA: Diagnosis not present

## 2022-03-22 DIAGNOSIS — K117 Disturbances of salivary secretion: Secondary | ICD-10-CM | POA: Diagnosis not present

## 2022-03-22 DIAGNOSIS — R439 Unspecified disturbances of smell and taste: Secondary | ICD-10-CM | POA: Diagnosis not present

## 2022-03-22 DIAGNOSIS — Z510 Encounter for antineoplastic radiation therapy: Secondary | ICD-10-CM | POA: Diagnosis not present

## 2022-03-22 DIAGNOSIS — C44222 Squamous cell carcinoma of skin of right ear and external auricular canal: Secondary | ICD-10-CM | POA: Diagnosis not present

## 2022-03-22 DIAGNOSIS — Z713 Dietary counseling and surveillance: Secondary | ICD-10-CM | POA: Diagnosis not present

## 2022-03-22 DIAGNOSIS — K5732 Diverticulitis of large intestine without perforation or abscess without bleeding: Secondary | ICD-10-CM | POA: Diagnosis not present

## 2022-03-22 DIAGNOSIS — L659 Nonscarring hair loss, unspecified: Secondary | ICD-10-CM | POA: Diagnosis not present

## 2022-03-23 DIAGNOSIS — K5732 Diverticulitis of large intestine without perforation or abscess without bleeding: Secondary | ICD-10-CM | POA: Diagnosis not present

## 2022-03-23 DIAGNOSIS — C7951 Secondary malignant neoplasm of bone: Secondary | ICD-10-CM | POA: Diagnosis not present

## 2022-03-23 DIAGNOSIS — C44222 Squamous cell carcinoma of skin of right ear and external auricular canal: Secondary | ICD-10-CM | POA: Diagnosis not present

## 2022-03-23 DIAGNOSIS — Z87891 Personal history of nicotine dependence: Secondary | ICD-10-CM | POA: Diagnosis not present

## 2022-03-23 DIAGNOSIS — Z713 Dietary counseling and surveillance: Secondary | ICD-10-CM | POA: Diagnosis not present

## 2022-03-23 DIAGNOSIS — L659 Nonscarring hair loss, unspecified: Secondary | ICD-10-CM | POA: Diagnosis not present

## 2022-03-23 DIAGNOSIS — R439 Unspecified disturbances of smell and taste: Secondary | ICD-10-CM | POA: Diagnosis not present

## 2022-03-23 DIAGNOSIS — Z510 Encounter for antineoplastic radiation therapy: Secondary | ICD-10-CM | POA: Diagnosis not present

## 2022-03-23 DIAGNOSIS — K117 Disturbances of salivary secretion: Secondary | ICD-10-CM | POA: Diagnosis not present

## 2022-03-24 DIAGNOSIS — C44222 Squamous cell carcinoma of skin of right ear and external auricular canal: Secondary | ICD-10-CM | POA: Diagnosis not present

## 2022-03-24 DIAGNOSIS — K5732 Diverticulitis of large intestine without perforation or abscess without bleeding: Secondary | ICD-10-CM | POA: Diagnosis not present

## 2022-03-24 DIAGNOSIS — Z713 Dietary counseling and surveillance: Secondary | ICD-10-CM | POA: Diagnosis not present

## 2022-03-24 DIAGNOSIS — Z87891 Personal history of nicotine dependence: Secondary | ICD-10-CM | POA: Diagnosis not present

## 2022-03-24 DIAGNOSIS — R439 Unspecified disturbances of smell and taste: Secondary | ICD-10-CM | POA: Diagnosis not present

## 2022-03-24 DIAGNOSIS — C7951 Secondary malignant neoplasm of bone: Secondary | ICD-10-CM | POA: Diagnosis not present

## 2022-03-24 DIAGNOSIS — K117 Disturbances of salivary secretion: Secondary | ICD-10-CM | POA: Diagnosis not present

## 2022-03-24 DIAGNOSIS — L659 Nonscarring hair loss, unspecified: Secondary | ICD-10-CM | POA: Diagnosis not present

## 2022-03-24 DIAGNOSIS — Z510 Encounter for antineoplastic radiation therapy: Secondary | ICD-10-CM | POA: Diagnosis not present

## 2022-03-27 DIAGNOSIS — Z87891 Personal history of nicotine dependence: Secondary | ICD-10-CM | POA: Diagnosis not present

## 2022-03-27 DIAGNOSIS — L659 Nonscarring hair loss, unspecified: Secondary | ICD-10-CM | POA: Diagnosis not present

## 2022-03-27 DIAGNOSIS — K117 Disturbances of salivary secretion: Secondary | ICD-10-CM | POA: Diagnosis not present

## 2022-03-27 DIAGNOSIS — Z510 Encounter for antineoplastic radiation therapy: Secondary | ICD-10-CM | POA: Diagnosis not present

## 2022-03-27 DIAGNOSIS — C7951 Secondary malignant neoplasm of bone: Secondary | ICD-10-CM | POA: Diagnosis not present

## 2022-03-27 DIAGNOSIS — K5732 Diverticulitis of large intestine without perforation or abscess without bleeding: Secondary | ICD-10-CM | POA: Diagnosis not present

## 2022-03-27 DIAGNOSIS — Z713 Dietary counseling and surveillance: Secondary | ICD-10-CM | POA: Diagnosis not present

## 2022-03-27 DIAGNOSIS — R439 Unspecified disturbances of smell and taste: Secondary | ICD-10-CM | POA: Diagnosis not present

## 2022-03-27 DIAGNOSIS — C44222 Squamous cell carcinoma of skin of right ear and external auricular canal: Secondary | ICD-10-CM | POA: Diagnosis not present

## 2022-03-28 DIAGNOSIS — L659 Nonscarring hair loss, unspecified: Secondary | ICD-10-CM | POA: Diagnosis not present

## 2022-03-28 DIAGNOSIS — K5732 Diverticulitis of large intestine without perforation or abscess without bleeding: Secondary | ICD-10-CM | POA: Diagnosis not present

## 2022-03-28 DIAGNOSIS — C44222 Squamous cell carcinoma of skin of right ear and external auricular canal: Secondary | ICD-10-CM | POA: Diagnosis not present

## 2022-03-28 DIAGNOSIS — Z87891 Personal history of nicotine dependence: Secondary | ICD-10-CM | POA: Diagnosis not present

## 2022-03-28 DIAGNOSIS — Z713 Dietary counseling and surveillance: Secondary | ICD-10-CM | POA: Diagnosis not present

## 2022-03-28 DIAGNOSIS — K117 Disturbances of salivary secretion: Secondary | ICD-10-CM | POA: Diagnosis not present

## 2022-03-28 DIAGNOSIS — R439 Unspecified disturbances of smell and taste: Secondary | ICD-10-CM | POA: Diagnosis not present

## 2022-03-28 DIAGNOSIS — C7951 Secondary malignant neoplasm of bone: Secondary | ICD-10-CM | POA: Diagnosis not present

## 2022-03-28 DIAGNOSIS — Z510 Encounter for antineoplastic radiation therapy: Secondary | ICD-10-CM | POA: Diagnosis not present

## 2022-03-29 ENCOUNTER — Other Ambulatory Visit: Payer: Self-pay | Admitting: Urology

## 2022-03-29 DIAGNOSIS — Z713 Dietary counseling and surveillance: Secondary | ICD-10-CM | POA: Diagnosis not present

## 2022-03-29 DIAGNOSIS — Z510 Encounter for antineoplastic radiation therapy: Secondary | ICD-10-CM | POA: Diagnosis not present

## 2022-03-29 DIAGNOSIS — C7951 Secondary malignant neoplasm of bone: Secondary | ICD-10-CM | POA: Diagnosis not present

## 2022-03-29 DIAGNOSIS — K5732 Diverticulitis of large intestine without perforation or abscess without bleeding: Secondary | ICD-10-CM | POA: Diagnosis not present

## 2022-03-29 DIAGNOSIS — Z87891 Personal history of nicotine dependence: Secondary | ICD-10-CM | POA: Diagnosis not present

## 2022-03-29 DIAGNOSIS — K117 Disturbances of salivary secretion: Secondary | ICD-10-CM | POA: Diagnosis not present

## 2022-03-29 DIAGNOSIS — R439 Unspecified disturbances of smell and taste: Secondary | ICD-10-CM | POA: Diagnosis not present

## 2022-03-29 DIAGNOSIS — L659 Nonscarring hair loss, unspecified: Secondary | ICD-10-CM | POA: Diagnosis not present

## 2022-03-29 DIAGNOSIS — C44222 Squamous cell carcinoma of skin of right ear and external auricular canal: Secondary | ICD-10-CM | POA: Diagnosis not present

## 2022-03-29 NOTE — Telephone Encounter (Signed)
Looks like pt is on Toviaz, I don't see Flomax and Finasteride on med list. Please advise

## 2022-03-30 DIAGNOSIS — K117 Disturbances of salivary secretion: Secondary | ICD-10-CM | POA: Diagnosis not present

## 2022-03-30 DIAGNOSIS — C44222 Squamous cell carcinoma of skin of right ear and external auricular canal: Secondary | ICD-10-CM | POA: Diagnosis not present

## 2022-03-30 DIAGNOSIS — Z510 Encounter for antineoplastic radiation therapy: Secondary | ICD-10-CM | POA: Diagnosis not present

## 2022-03-30 DIAGNOSIS — Z87891 Personal history of nicotine dependence: Secondary | ICD-10-CM | POA: Diagnosis not present

## 2022-03-30 DIAGNOSIS — R439 Unspecified disturbances of smell and taste: Secondary | ICD-10-CM | POA: Diagnosis not present

## 2022-03-30 DIAGNOSIS — K5732 Diverticulitis of large intestine without perforation or abscess without bleeding: Secondary | ICD-10-CM | POA: Diagnosis not present

## 2022-03-30 DIAGNOSIS — Z713 Dietary counseling and surveillance: Secondary | ICD-10-CM | POA: Diagnosis not present

## 2022-03-30 DIAGNOSIS — L659 Nonscarring hair loss, unspecified: Secondary | ICD-10-CM | POA: Diagnosis not present

## 2022-03-30 DIAGNOSIS — C7951 Secondary malignant neoplasm of bone: Secondary | ICD-10-CM | POA: Diagnosis not present

## 2022-03-31 DIAGNOSIS — Z713 Dietary counseling and surveillance: Secondary | ICD-10-CM | POA: Diagnosis not present

## 2022-03-31 DIAGNOSIS — C44222 Squamous cell carcinoma of skin of right ear and external auricular canal: Secondary | ICD-10-CM | POA: Diagnosis not present

## 2022-03-31 DIAGNOSIS — C7951 Secondary malignant neoplasm of bone: Secondary | ICD-10-CM | POA: Diagnosis not present

## 2022-03-31 DIAGNOSIS — R439 Unspecified disturbances of smell and taste: Secondary | ICD-10-CM | POA: Diagnosis not present

## 2022-03-31 DIAGNOSIS — Z510 Encounter for antineoplastic radiation therapy: Secondary | ICD-10-CM | POA: Diagnosis not present

## 2022-03-31 DIAGNOSIS — K117 Disturbances of salivary secretion: Secondary | ICD-10-CM | POA: Diagnosis not present

## 2022-03-31 DIAGNOSIS — K5732 Diverticulitis of large intestine without perforation or abscess without bleeding: Secondary | ICD-10-CM | POA: Diagnosis not present

## 2022-03-31 DIAGNOSIS — L659 Nonscarring hair loss, unspecified: Secondary | ICD-10-CM | POA: Diagnosis not present

## 2022-03-31 DIAGNOSIS — Z87891 Personal history of nicotine dependence: Secondary | ICD-10-CM | POA: Diagnosis not present

## 2022-04-03 DIAGNOSIS — Z510 Encounter for antineoplastic radiation therapy: Secondary | ICD-10-CM | POA: Diagnosis not present

## 2022-04-03 DIAGNOSIS — Z87891 Personal history of nicotine dependence: Secondary | ICD-10-CM | POA: Diagnosis not present

## 2022-04-03 DIAGNOSIS — K5732 Diverticulitis of large intestine without perforation or abscess without bleeding: Secondary | ICD-10-CM | POA: Diagnosis not present

## 2022-04-03 DIAGNOSIS — L659 Nonscarring hair loss, unspecified: Secondary | ICD-10-CM | POA: Diagnosis not present

## 2022-04-03 DIAGNOSIS — C44222 Squamous cell carcinoma of skin of right ear and external auricular canal: Secondary | ICD-10-CM | POA: Diagnosis not present

## 2022-04-03 DIAGNOSIS — K117 Disturbances of salivary secretion: Secondary | ICD-10-CM | POA: Diagnosis not present

## 2022-04-03 DIAGNOSIS — Z713 Dietary counseling and surveillance: Secondary | ICD-10-CM | POA: Diagnosis not present

## 2022-04-03 DIAGNOSIS — R439 Unspecified disturbances of smell and taste: Secondary | ICD-10-CM | POA: Diagnosis not present

## 2022-04-03 DIAGNOSIS — C7951 Secondary malignant neoplasm of bone: Secondary | ICD-10-CM | POA: Diagnosis not present

## 2022-04-04 DIAGNOSIS — Z87891 Personal history of nicotine dependence: Secondary | ICD-10-CM | POA: Diagnosis not present

## 2022-04-04 DIAGNOSIS — Z510 Encounter for antineoplastic radiation therapy: Secondary | ICD-10-CM | POA: Diagnosis not present

## 2022-04-04 DIAGNOSIS — R439 Unspecified disturbances of smell and taste: Secondary | ICD-10-CM | POA: Diagnosis not present

## 2022-04-04 DIAGNOSIS — C7951 Secondary malignant neoplasm of bone: Secondary | ICD-10-CM | POA: Diagnosis not present

## 2022-04-04 DIAGNOSIS — Z713 Dietary counseling and surveillance: Secondary | ICD-10-CM | POA: Diagnosis not present

## 2022-04-04 DIAGNOSIS — C44222 Squamous cell carcinoma of skin of right ear and external auricular canal: Secondary | ICD-10-CM | POA: Diagnosis not present

## 2022-04-04 DIAGNOSIS — L659 Nonscarring hair loss, unspecified: Secondary | ICD-10-CM | POA: Diagnosis not present

## 2022-04-04 DIAGNOSIS — K5732 Diverticulitis of large intestine without perforation or abscess without bleeding: Secondary | ICD-10-CM | POA: Diagnosis not present

## 2022-04-04 DIAGNOSIS — K117 Disturbances of salivary secretion: Secondary | ICD-10-CM | POA: Diagnosis not present

## 2022-04-05 ENCOUNTER — Other Ambulatory Visit: Payer: Medicare HMO

## 2022-04-05 DIAGNOSIS — Z713 Dietary counseling and surveillance: Secondary | ICD-10-CM | POA: Diagnosis not present

## 2022-04-05 DIAGNOSIS — R439 Unspecified disturbances of smell and taste: Secondary | ICD-10-CM | POA: Diagnosis not present

## 2022-04-05 DIAGNOSIS — Z87891 Personal history of nicotine dependence: Secondary | ICD-10-CM | POA: Diagnosis not present

## 2022-04-05 DIAGNOSIS — K5732 Diverticulitis of large intestine without perforation or abscess without bleeding: Secondary | ICD-10-CM | POA: Diagnosis not present

## 2022-04-05 DIAGNOSIS — C44222 Squamous cell carcinoma of skin of right ear and external auricular canal: Secondary | ICD-10-CM | POA: Diagnosis not present

## 2022-04-05 DIAGNOSIS — L659 Nonscarring hair loss, unspecified: Secondary | ICD-10-CM | POA: Diagnosis not present

## 2022-04-05 DIAGNOSIS — C7951 Secondary malignant neoplasm of bone: Secondary | ICD-10-CM | POA: Diagnosis not present

## 2022-04-05 DIAGNOSIS — Z510 Encounter for antineoplastic radiation therapy: Secondary | ICD-10-CM | POA: Diagnosis not present

## 2022-04-05 DIAGNOSIS — K117 Disturbances of salivary secretion: Secondary | ICD-10-CM | POA: Diagnosis not present

## 2022-04-07 ENCOUNTER — Other Ambulatory Visit: Payer: Medicare HMO

## 2022-04-10 DIAGNOSIS — K117 Disturbances of salivary secretion: Secondary | ICD-10-CM | POA: Diagnosis not present

## 2022-04-10 DIAGNOSIS — Z510 Encounter for antineoplastic radiation therapy: Secondary | ICD-10-CM | POA: Diagnosis not present

## 2022-04-10 DIAGNOSIS — R439 Unspecified disturbances of smell and taste: Secondary | ICD-10-CM | POA: Diagnosis not present

## 2022-04-10 DIAGNOSIS — C7951 Secondary malignant neoplasm of bone: Secondary | ICD-10-CM | POA: Diagnosis not present

## 2022-04-10 DIAGNOSIS — L659 Nonscarring hair loss, unspecified: Secondary | ICD-10-CM | POA: Diagnosis not present

## 2022-04-10 DIAGNOSIS — C44222 Squamous cell carcinoma of skin of right ear and external auricular canal: Secondary | ICD-10-CM | POA: Diagnosis not present

## 2022-04-10 DIAGNOSIS — Z713 Dietary counseling and surveillance: Secondary | ICD-10-CM | POA: Diagnosis not present

## 2022-04-10 DIAGNOSIS — K5732 Diverticulitis of large intestine without perforation or abscess without bleeding: Secondary | ICD-10-CM | POA: Diagnosis not present

## 2022-04-10 DIAGNOSIS — Z87891 Personal history of nicotine dependence: Secondary | ICD-10-CM | POA: Diagnosis not present

## 2022-04-11 DIAGNOSIS — R439 Unspecified disturbances of smell and taste: Secondary | ICD-10-CM | POA: Diagnosis not present

## 2022-04-11 DIAGNOSIS — Z510 Encounter for antineoplastic radiation therapy: Secondary | ICD-10-CM | POA: Diagnosis not present

## 2022-04-11 DIAGNOSIS — Z87891 Personal history of nicotine dependence: Secondary | ICD-10-CM | POA: Diagnosis not present

## 2022-04-11 DIAGNOSIS — L659 Nonscarring hair loss, unspecified: Secondary | ICD-10-CM | POA: Diagnosis not present

## 2022-04-11 DIAGNOSIS — Z713 Dietary counseling and surveillance: Secondary | ICD-10-CM | POA: Diagnosis not present

## 2022-04-11 DIAGNOSIS — K117 Disturbances of salivary secretion: Secondary | ICD-10-CM | POA: Diagnosis not present

## 2022-04-11 DIAGNOSIS — K5732 Diverticulitis of large intestine without perforation or abscess without bleeding: Secondary | ICD-10-CM | POA: Diagnosis not present

## 2022-04-11 DIAGNOSIS — C7951 Secondary malignant neoplasm of bone: Secondary | ICD-10-CM | POA: Diagnosis not present

## 2022-04-11 DIAGNOSIS — C44222 Squamous cell carcinoma of skin of right ear and external auricular canal: Secondary | ICD-10-CM | POA: Diagnosis not present

## 2022-04-12 ENCOUNTER — Ambulatory Visit: Payer: Medicare HMO | Admitting: Urology

## 2022-04-12 DIAGNOSIS — C7951 Secondary malignant neoplasm of bone: Secondary | ICD-10-CM | POA: Diagnosis not present

## 2022-04-12 DIAGNOSIS — Z713 Dietary counseling and surveillance: Secondary | ICD-10-CM | POA: Diagnosis not present

## 2022-04-12 DIAGNOSIS — C44222 Squamous cell carcinoma of skin of right ear and external auricular canal: Secondary | ICD-10-CM | POA: Diagnosis not present

## 2022-04-12 DIAGNOSIS — R439 Unspecified disturbances of smell and taste: Secondary | ICD-10-CM | POA: Diagnosis not present

## 2022-04-12 DIAGNOSIS — Z87891 Personal history of nicotine dependence: Secondary | ICD-10-CM | POA: Diagnosis not present

## 2022-04-12 DIAGNOSIS — Z510 Encounter for antineoplastic radiation therapy: Secondary | ICD-10-CM | POA: Diagnosis not present

## 2022-04-12 DIAGNOSIS — K117 Disturbances of salivary secretion: Secondary | ICD-10-CM | POA: Diagnosis not present

## 2022-04-12 DIAGNOSIS — K5732 Diverticulitis of large intestine without perforation or abscess without bleeding: Secondary | ICD-10-CM | POA: Diagnosis not present

## 2022-04-12 DIAGNOSIS — L659 Nonscarring hair loss, unspecified: Secondary | ICD-10-CM | POA: Diagnosis not present

## 2022-04-13 DIAGNOSIS — C44222 Squamous cell carcinoma of skin of right ear and external auricular canal: Secondary | ICD-10-CM | POA: Diagnosis not present

## 2022-04-13 DIAGNOSIS — C7951 Secondary malignant neoplasm of bone: Secondary | ICD-10-CM | POA: Diagnosis not present

## 2022-04-13 DIAGNOSIS — K117 Disturbances of salivary secretion: Secondary | ICD-10-CM | POA: Diagnosis not present

## 2022-04-13 DIAGNOSIS — Z510 Encounter for antineoplastic radiation therapy: Secondary | ICD-10-CM | POA: Diagnosis not present

## 2022-04-13 DIAGNOSIS — Z713 Dietary counseling and surveillance: Secondary | ICD-10-CM | POA: Diagnosis not present

## 2022-04-13 DIAGNOSIS — R439 Unspecified disturbances of smell and taste: Secondary | ICD-10-CM | POA: Diagnosis not present

## 2022-04-13 DIAGNOSIS — K5732 Diverticulitis of large intestine without perforation or abscess without bleeding: Secondary | ICD-10-CM | POA: Diagnosis not present

## 2022-04-13 DIAGNOSIS — Z87891 Personal history of nicotine dependence: Secondary | ICD-10-CM | POA: Diagnosis not present

## 2022-04-13 DIAGNOSIS — L659 Nonscarring hair loss, unspecified: Secondary | ICD-10-CM | POA: Diagnosis not present

## 2022-04-14 DIAGNOSIS — R432 Parageusia: Secondary | ICD-10-CM | POA: Diagnosis not present

## 2022-04-14 DIAGNOSIS — Z87891 Personal history of nicotine dependence: Secondary | ICD-10-CM | POA: Diagnosis not present

## 2022-04-14 DIAGNOSIS — Z510 Encounter for antineoplastic radiation therapy: Secondary | ICD-10-CM | POA: Diagnosis not present

## 2022-04-14 DIAGNOSIS — R5383 Other fatigue: Secondary | ICD-10-CM | POA: Diagnosis not present

## 2022-04-14 DIAGNOSIS — C44222 Squamous cell carcinoma of skin of right ear and external auricular canal: Secondary | ICD-10-CM | POA: Diagnosis not present

## 2022-04-14 DIAGNOSIS — R1310 Dysphagia, unspecified: Secondary | ICD-10-CM | POA: Diagnosis not present

## 2022-04-14 DIAGNOSIS — R530 Neoplastic (malignant) related fatigue: Secondary | ICD-10-CM | POA: Diagnosis not present

## 2022-04-14 DIAGNOSIS — Z79899 Other long term (current) drug therapy: Secondary | ICD-10-CM | POA: Diagnosis not present

## 2022-04-14 DIAGNOSIS — K117 Disturbances of salivary secretion: Secondary | ICD-10-CM | POA: Diagnosis not present

## 2022-04-17 DIAGNOSIS — R530 Neoplastic (malignant) related fatigue: Secondary | ICD-10-CM | POA: Diagnosis not present

## 2022-04-17 DIAGNOSIS — Z87891 Personal history of nicotine dependence: Secondary | ICD-10-CM | POA: Diagnosis not present

## 2022-04-17 DIAGNOSIS — C44222 Squamous cell carcinoma of skin of right ear and external auricular canal: Secondary | ICD-10-CM | POA: Diagnosis not present

## 2022-04-17 DIAGNOSIS — Z510 Encounter for antineoplastic radiation therapy: Secondary | ICD-10-CM | POA: Diagnosis not present

## 2022-04-17 DIAGNOSIS — R432 Parageusia: Secondary | ICD-10-CM | POA: Diagnosis not present

## 2022-04-17 DIAGNOSIS — R5383 Other fatigue: Secondary | ICD-10-CM | POA: Diagnosis not present

## 2022-04-17 DIAGNOSIS — K117 Disturbances of salivary secretion: Secondary | ICD-10-CM | POA: Diagnosis not present

## 2022-04-17 DIAGNOSIS — R1310 Dysphagia, unspecified: Secondary | ICD-10-CM | POA: Diagnosis not present

## 2022-04-17 DIAGNOSIS — Z79899 Other long term (current) drug therapy: Secondary | ICD-10-CM | POA: Diagnosis not present

## 2022-04-18 DIAGNOSIS — R5383 Other fatigue: Secondary | ICD-10-CM | POA: Diagnosis not present

## 2022-04-18 DIAGNOSIS — R530 Neoplastic (malignant) related fatigue: Secondary | ICD-10-CM | POA: Diagnosis not present

## 2022-04-18 DIAGNOSIS — Z79899 Other long term (current) drug therapy: Secondary | ICD-10-CM | POA: Diagnosis not present

## 2022-04-18 DIAGNOSIS — Z87891 Personal history of nicotine dependence: Secondary | ICD-10-CM | POA: Diagnosis not present

## 2022-04-18 DIAGNOSIS — K117 Disturbances of salivary secretion: Secondary | ICD-10-CM | POA: Diagnosis not present

## 2022-04-18 DIAGNOSIS — Z510 Encounter for antineoplastic radiation therapy: Secondary | ICD-10-CM | POA: Diagnosis not present

## 2022-04-18 DIAGNOSIS — R1310 Dysphagia, unspecified: Secondary | ICD-10-CM | POA: Diagnosis not present

## 2022-04-18 DIAGNOSIS — R432 Parageusia: Secondary | ICD-10-CM | POA: Diagnosis not present

## 2022-04-18 DIAGNOSIS — C44222 Squamous cell carcinoma of skin of right ear and external auricular canal: Secondary | ICD-10-CM | POA: Diagnosis not present

## 2022-04-19 DIAGNOSIS — R1310 Dysphagia, unspecified: Secondary | ICD-10-CM | POA: Diagnosis not present

## 2022-04-19 DIAGNOSIS — C44222 Squamous cell carcinoma of skin of right ear and external auricular canal: Secondary | ICD-10-CM | POA: Diagnosis not present

## 2022-04-19 DIAGNOSIS — Z510 Encounter for antineoplastic radiation therapy: Secondary | ICD-10-CM | POA: Diagnosis not present

## 2022-04-19 DIAGNOSIS — Z87891 Personal history of nicotine dependence: Secondary | ICD-10-CM | POA: Diagnosis not present

## 2022-04-19 DIAGNOSIS — Z79899 Other long term (current) drug therapy: Secondary | ICD-10-CM | POA: Diagnosis not present

## 2022-04-19 DIAGNOSIS — K117 Disturbances of salivary secretion: Secondary | ICD-10-CM | POA: Diagnosis not present

## 2022-04-19 DIAGNOSIS — R432 Parageusia: Secondary | ICD-10-CM | POA: Diagnosis not present

## 2022-04-19 DIAGNOSIS — R5383 Other fatigue: Secondary | ICD-10-CM | POA: Diagnosis not present

## 2022-04-19 DIAGNOSIS — R530 Neoplastic (malignant) related fatigue: Secondary | ICD-10-CM | POA: Diagnosis not present

## 2022-04-20 DIAGNOSIS — C44222 Squamous cell carcinoma of skin of right ear and external auricular canal: Secondary | ICD-10-CM | POA: Diagnosis not present

## 2022-04-20 DIAGNOSIS — K117 Disturbances of salivary secretion: Secondary | ICD-10-CM | POA: Diagnosis not present

## 2022-04-20 DIAGNOSIS — R5383 Other fatigue: Secondary | ICD-10-CM | POA: Diagnosis not present

## 2022-04-20 DIAGNOSIS — R432 Parageusia: Secondary | ICD-10-CM | POA: Diagnosis not present

## 2022-04-20 DIAGNOSIS — R1310 Dysphagia, unspecified: Secondary | ICD-10-CM | POA: Diagnosis not present

## 2022-04-20 DIAGNOSIS — Z510 Encounter for antineoplastic radiation therapy: Secondary | ICD-10-CM | POA: Diagnosis not present

## 2022-04-20 DIAGNOSIS — Z87891 Personal history of nicotine dependence: Secondary | ICD-10-CM | POA: Diagnosis not present

## 2022-04-20 DIAGNOSIS — R530 Neoplastic (malignant) related fatigue: Secondary | ICD-10-CM | POA: Diagnosis not present

## 2022-04-20 DIAGNOSIS — Z79899 Other long term (current) drug therapy: Secondary | ICD-10-CM | POA: Diagnosis not present

## 2022-04-21 DIAGNOSIS — K117 Disturbances of salivary secretion: Secondary | ICD-10-CM | POA: Diagnosis not present

## 2022-04-21 DIAGNOSIS — R1310 Dysphagia, unspecified: Secondary | ICD-10-CM | POA: Diagnosis not present

## 2022-04-21 DIAGNOSIS — R5383 Other fatigue: Secondary | ICD-10-CM | POA: Diagnosis not present

## 2022-04-21 DIAGNOSIS — Z87891 Personal history of nicotine dependence: Secondary | ICD-10-CM | POA: Diagnosis not present

## 2022-04-21 DIAGNOSIS — R432 Parageusia: Secondary | ICD-10-CM | POA: Diagnosis not present

## 2022-04-21 DIAGNOSIS — C44222 Squamous cell carcinoma of skin of right ear and external auricular canal: Secondary | ICD-10-CM | POA: Diagnosis not present

## 2022-04-21 DIAGNOSIS — Z510 Encounter for antineoplastic radiation therapy: Secondary | ICD-10-CM | POA: Diagnosis not present

## 2022-04-21 DIAGNOSIS — R530 Neoplastic (malignant) related fatigue: Secondary | ICD-10-CM | POA: Diagnosis not present

## 2022-04-21 DIAGNOSIS — Z79899 Other long term (current) drug therapy: Secondary | ICD-10-CM | POA: Diagnosis not present

## 2022-05-22 DIAGNOSIS — H903 Sensorineural hearing loss, bilateral: Secondary | ICD-10-CM | POA: Diagnosis not present

## 2022-06-01 DIAGNOSIS — H903 Sensorineural hearing loss, bilateral: Secondary | ICD-10-CM | POA: Diagnosis not present

## 2022-06-14 ENCOUNTER — Ambulatory Visit: Payer: Medicare HMO | Admitting: Dermatology

## 2022-06-14 VITALS — BP 126/73 | HR 84

## 2022-06-14 DIAGNOSIS — L814 Other melanin hyperpigmentation: Secondary | ICD-10-CM | POA: Diagnosis not present

## 2022-06-14 DIAGNOSIS — Z86018 Personal history of other benign neoplasm: Secondary | ICD-10-CM | POA: Diagnosis not present

## 2022-06-14 DIAGNOSIS — L578 Other skin changes due to chronic exposure to nonionizing radiation: Secondary | ICD-10-CM | POA: Diagnosis not present

## 2022-06-14 DIAGNOSIS — Z85828 Personal history of other malignant neoplasm of skin: Secondary | ICD-10-CM

## 2022-06-14 DIAGNOSIS — Z1283 Encounter for screening for malignant neoplasm of skin: Secondary | ICD-10-CM | POA: Diagnosis not present

## 2022-06-14 DIAGNOSIS — L570 Actinic keratosis: Secondary | ICD-10-CM | POA: Diagnosis not present

## 2022-06-14 DIAGNOSIS — L720 Epidermal cyst: Secondary | ICD-10-CM | POA: Diagnosis not present

## 2022-06-14 DIAGNOSIS — D229 Melanocytic nevi, unspecified: Secondary | ICD-10-CM

## 2022-06-14 DIAGNOSIS — Z8589 Personal history of malignant neoplasm of other organs and systems: Secondary | ICD-10-CM

## 2022-06-14 DIAGNOSIS — L821 Other seborrheic keratosis: Secondary | ICD-10-CM

## 2022-06-14 NOTE — Progress Notes (Signed)
Follow-Up Visit   Subjective  Patrick Shepherd is a 77 y.o. male who presents for the following: Total body skin exam (Hx of SCCs, hx of Dysplastic Nevus R post deltoid, hx of AKs) and check spot (L shoulder, bump, ~78m. The patient presents for Total-Body Skin Exam (TBSE) for skin cancer screening and mole check.  The patient has spots, moles and lesions to be evaluated, some may be new or changing and the patient has concerns that these could be cancer.   The following portions of the chart were reviewed this encounter and updated as appropriate:   Tobacco  Allergies  Meds  Problems  Med Hx  Surg Hx  Fam Hx     Review of Systems:  No other skin or systemic complaints except as noted in HPI or Assessment and Plan.  Objective  Well appearing patient in no apparent distress; mood and affect are within normal limits.  A full examination was performed including scalp, head, eyes, ears, nose, lips, neck, chest, axillae, abdomen, back, buttocks, bilateral upper extremities, bilateral lower extremities, hands, feet, fingers, toes, fingernails, and toenails. All findings within normal limits unless otherwise noted below.  L post shoulder 1.5cm cystic pap  R nasal tip x 1, R ear x 1, L ear x 1 (3) Pink scaly macules   Assessment & Plan   Lentigines - Scattered tan macules - Due to sun exposure - Benign-appearing, observe - Recommend daily broad spectrum sunscreen SPF 30+ to sun-exposed areas, reapply every 2 hours as needed. - Call for any changes  Seborrheic Keratoses - Stuck-on, waxy, tan-brown papules and/or plaques  - Benign-appearing - Discussed benign etiology and prognosis. - Observe - Call for any changes  Melanocytic Nevi - Tan-brown and/or pink-flesh-colored symmetric macules and papules - Benign appearing on exam today - Observation - Call clinic for new or changing moles - Recommend daily use of broad spectrum spf 30+ sunscreen to sun-exposed areas.    Hemangiomas - Red papules - Discussed benign nature - Observe - Call for any changes  Actinic Damage - Chronic condition, secondary to cumulative UV/sun exposure - diffuse scaly erythematous macules with underlying dyspigmentation - Recommend daily broad spectrum sunscreen SPF 30+ to sun-exposed areas, reapply every 2 hours as needed.  - Staying in the shade or wearing long sleeves, sun glasses (UVA+UVB protection) and wide brim hats (4-inch brim around the entire circumference of the hat) are also recommended for sun protection.  - Call for new or changing lesions.  Skin cancer screening performed today.   History of Squamous Cell Carcinoma of the Skin - No evidence of recurrence today - No lymphadenopathy - Recommend regular full body skin exams - Recommend daily broad spectrum sunscreen SPF 30+ to sun-exposed areas, reapply every 2 hours as needed.  - Call if any new or changing lesions are noted between office visits - R mid lat forearm, L post shoulder, R buccal cheek  History of Dysplastic Nevi - No evidence of recurrence today - Recommend regular full body skin exams - Recommend daily broad spectrum sunscreen SPF 30+ to sun-exposed areas, reapply every 2 hours as needed.  - Call if any new or changing lesions are noted between office visits  - R post deltoid  Epidermal cyst L post shoulder Benign-appearing. Exam most consistent with an epidermal inclusion cyst. Discussed that a cyst is a benign growth that can grow over time and sometimes get irritated or inflamed. Recommend observation if it is not bothersome. Discussed option  of surgical excision to remove it if it is growing, symptomatic, or other changes noted. Please call for new or changing lesions so they can be evaluated.  AK (actinic keratosis) (3) R nasal tip x 1, R ear x 1, L ear x 1 Destruction of lesion - R nasal tip x 1, R ear x 1, L ear x 1 Complexity: simple   Destruction method: cryotherapy    Informed consent: discussed and consent obtained   Timeout:  patient name, date of birth, surgical site, and procedure verified Lesion destroyed using liquid nitrogen: Yes   Region frozen until ice ball extended beyond lesion: Yes   Outcome: patient tolerated procedure well with no complications   Post-procedure details: wound care instructions given    History of Squamous Cell Carcinoma of skin and ear and external auditory canal - R ear, treated at [facility]  Return in about 1 year (around 06/15/2023) for TBSE, Hx of SCC, Hx of Dysplastic nevi, Hx of AKs.  I, Patrick Shepherd, RMA, am acting as scribe for Sarina Ser, MD . Documentation: I have reviewed the above documentation for accuracy and completeness, and I agree with the above.  Sarina Ser, MD

## 2022-06-14 NOTE — Patient Instructions (Addendum)
Cryotherapy Aftercare  Wash gently with soap and water everyday.   Apply Vaseline and Band-Aid daily until healed.     Due to recent changes in healthcare laws, you may see results of your pathology and/or laboratory studies on MyChart before the doctors have had a chance to review them. We understand that in some cases there may be results that are confusing or concerning to you. Please understand that not all results are received at the same time and often the doctors may need to interpret multiple results in order to provide you with the best plan of care or course of treatment. Therefore, we ask that you please give us 2 business days to thoroughly review all your results before contacting the office for clarification. Should we see a critical lab result, you will be contacted sooner.   If You Need Anything After Your Visit  If you have any questions or concerns for your doctor, please call our main line at [phone] and press option 4 to reach your doctor's medical assistant. If no one answers, please leave a voicemail as directed and we will return your call as soon as possible. Messages left after 4 pm will be answered the following business day.   You may also send us a message via MyChart. We typically respond to MyChart messages within 1-2 business days.  For prescription refills, please ask your pharmacy to contact our office. Our fax number is [phone].  If you have an urgent issue when the clinic is closed that cannot wait until the next business day, you can page your doctor at the number below.    Please note that while we do our best to be available for urgent issues outside of office hours, we are not available 24/7.   If you have an urgent issue and are unable to reach us, you may choose to seek medical care at your doctor's office, retail clinic, urgent care center, or emergency room.  If you have a medical emergency, please immediately call 911 or go to the  emergency department.  Pager Numbers  - Dr. Kowalski: [phone]  - Dr. Moye: [phone]  - Dr. Stewart: [phone]  In the event of inclement weather, please call our main line at [phone] for an update on the status of any delays or closures.  Dermatology Medication Tips: Please keep the boxes that topical medications come in in order to help keep track of the instructions about where and how to use these. Pharmacies typically print the medication instructions only on the boxes and not directly on the medication tubes.   If your medication is too expensive, please contact our office at [phone] option 4 or send us a message through MyChart.   We are unable to tell what your co-pay for medications will be in advance as this is different depending on your insurance coverage. However, we may be able to find a substitute medication at lower cost or fill out paperwork to get insurance to cover a needed medication.   If a prior authorization is required to get your medication covered by your insurance company, please allow us 1-2 business days to complete this process.  Drug prices often vary depending on where the prescription is filled and some pharmacies may offer cheaper prices.  The website www.goodrx.com contains coupons for medications through different pharmacies. The prices here do not account for what the cost may be with help from insurance (it may be cheaper with your insurance), but the website can   give you the price if you did not use any insurance.  - You can print the associated coupon and take it with your prescription to the pharmacy.  - You may also stop by our office during regular business hours and pick up a GoodRx coupon card.  - If you need your prescription sent electronically to a different pharmacy, notify our office through [facility] MyChart or by phone at [phone] option 4.     Si Usted Necesita Algo Despus de Su Visita  Tambin puede  enviarnos un mensaje a travs de MyChart. Por lo general respondemos a los mensajes de MyChart en el transcurso de 1 a 2 das hbiles.  Para renovar recetas, por favor pida a su farmacia que se ponga en contacto con nuestra oficina. Nuestro nmero de fax es el [phone].  Si tiene un asunto urgente cuando la clnica est cerrada y que no puede esperar hasta el siguiente da hbil, puede llamar/localizar a su doctor(a) al nmero que aparece a continuacin.   Por favor, tenga en cuenta que aunque hacemos todo lo posible para estar disponibles para asuntos urgentes fuera del horario de oficina, no estamos disponibles las 24 horas del da, los 7 das de la semana.   Si tiene un problema urgente y no puede comunicarse con nosotros, puede optar por buscar atencin mdica  en el consultorio de su doctor(a), en una clnica privada, en un centro de atencin urgente o en una sala de emergencias.  Si tiene una emergencia mdica, por favor llame inmediatamente al 911 o vaya a la sala de emergencias.  Nmeros de bper  - Dr. Kowalski: [phone]  - Dra. Moye: [phone]  - Dra. Stewart: [phone]  En caso de inclemencias del tiempo, por favor llame a nuestra lnea principal al [phone] para una actualizacin sobre el estado de cualquier retraso o cierre.  Consejos para la medicacin en dermatologa: Por favor, guarde las cajas en las que vienen los medicamentos de uso tpico para ayudarle a seguir las instrucciones sobre dnde y cmo usarlos. Las farmacias generalmente imprimen las instrucciones del medicamento slo en las cajas y no directamente en los tubos del medicamento.   Si su medicamento es muy caro, por favor, pngase en contacto con nuestra oficina llamando al [phone] y presione la opcin 4 o envenos un mensaje a travs de MyChart.   No podemos decirle cul ser su copago por los medicamentos por adelantado ya que esto es diferente dependiendo de la cobertura de su seguro.  Sin embargo, es posible que podamos encontrar un medicamento sustituto a menor costo o llenar un formulario para que el seguro cubra el medicamento que se considera necesario.   Si se requiere una autorizacin previa para que su compaa de seguros cubra su medicamento, por favor permtanos de 1 a 2 das hbiles para completar este proceso.  Los precios de los medicamentos varan con frecuencia dependiendo del lugar de dnde se surte la receta y alguna farmacias pueden ofrecer precios ms baratos.  El sitio web www.goodrx.com tiene cupones para medicamentos de diferentes farmacias. Los precios aqu no tienen en cuenta lo que podra costar con la ayuda del seguro (puede ser ms barato con su seguro), pero el sitio web puede darle el precio si no utiliz ningn seguro.  - Puede imprimir el cupn correspondiente y llevarlo con su receta a la farmacia.  - Tambin puede pasar por nuestra oficina durante el horario de atencin regular y recoger una tarjeta de cupones de GoodRx.  -   Si necesita que su receta se enve electrnicamente a una farmacia diferente, informe a nuestra oficina a travs de MyChart de [facility] o por telfono llamando al [phone] y presione la opcin 4.  

## 2022-06-17 ENCOUNTER — Encounter: Payer: Self-pay | Admitting: Dermatology

## 2022-06-23 DIAGNOSIS — Z09 Encounter for follow-up examination after completed treatment for conditions other than malignant neoplasm: Secondary | ICD-10-CM | POA: Diagnosis not present

## 2022-06-23 DIAGNOSIS — C410 Malignant neoplasm of bones of skull and face: Secondary | ICD-10-CM | POA: Diagnosis not present

## 2022-07-10 DIAGNOSIS — E782 Mixed hyperlipidemia: Secondary | ICD-10-CM | POA: Diagnosis not present

## 2022-07-10 DIAGNOSIS — K219 Gastro-esophageal reflux disease without esophagitis: Secondary | ICD-10-CM | POA: Diagnosis not present

## 2022-07-17 DIAGNOSIS — Z8546 Personal history of malignant neoplasm of prostate: Secondary | ICD-10-CM | POA: Diagnosis not present

## 2022-07-17 DIAGNOSIS — Z0000 Encounter for general adult medical examination without abnormal findings: Secondary | ICD-10-CM | POA: Diagnosis not present

## 2022-07-17 DIAGNOSIS — Z1331 Encounter for screening for depression: Secondary | ICD-10-CM | POA: Diagnosis not present

## 2022-07-17 DIAGNOSIS — Z85828 Personal history of other malignant neoplasm of skin: Secondary | ICD-10-CM | POA: Insufficient documentation

## 2022-07-17 DIAGNOSIS — E785 Hyperlipidemia, unspecified: Secondary | ICD-10-CM | POA: Diagnosis not present

## 2022-08-17 DIAGNOSIS — Z9889 Other specified postprocedural states: Secondary | ICD-10-CM | POA: Diagnosis not present

## 2022-08-17 DIAGNOSIS — C44222 Squamous cell carcinoma of skin of right ear and external auricular canal: Secondary | ICD-10-CM | POA: Diagnosis not present

## 2022-10-04 ENCOUNTER — Other Ambulatory Visit: Payer: Self-pay

## 2022-10-04 DIAGNOSIS — C61 Malignant neoplasm of prostate: Secondary | ICD-10-CM

## 2022-10-05 ENCOUNTER — Other Ambulatory Visit: Payer: Medicare HMO

## 2022-10-05 DIAGNOSIS — C61 Malignant neoplasm of prostate: Secondary | ICD-10-CM

## 2022-10-06 LAB — PSA: Prostate Specific Ag, Serum: 1.3 ng/mL (ref 0.0–4.0)

## 2022-10-17 ENCOUNTER — Ambulatory Visit: Payer: Medicare HMO | Admitting: Urology

## 2022-10-17 VITALS — BP 148/74 | HR 79 | Ht 71.0 in | Wt 193.0 lb

## 2022-10-17 DIAGNOSIS — Z8546 Personal history of malignant neoplasm of prostate: Secondary | ICD-10-CM | POA: Diagnosis not present

## 2022-10-17 DIAGNOSIS — N3941 Urge incontinence: Secondary | ICD-10-CM

## 2022-10-17 DIAGNOSIS — C61 Malignant neoplasm of prostate: Secondary | ICD-10-CM

## 2022-10-17 DIAGNOSIS — R350 Frequency of micturition: Secondary | ICD-10-CM | POA: Diagnosis not present

## 2022-10-17 DIAGNOSIS — N401 Enlarged prostate with lower urinary tract symptoms: Secondary | ICD-10-CM

## 2022-10-17 LAB — MICROSCOPIC EXAMINATION

## 2022-10-17 LAB — URINALYSIS, COMPLETE
Bilirubin, UA: NEGATIVE
Glucose, UA: NEGATIVE
Ketones, UA: NEGATIVE
Leukocytes,UA: NEGATIVE
Nitrite, UA: NEGATIVE
Protein,UA: NEGATIVE
RBC, UA: NEGATIVE
Specific Gravity, UA: 1.025 (ref 1.005–1.030)
Urobilinogen, Ur: 0.2 mg/dL (ref 0.2–1.0)
pH, UA: 5.5 (ref 5.0–7.5)

## 2022-10-17 LAB — BLADDER SCAN AMB NON-IMAGING: Scan Result: 33

## 2022-10-17 NOTE — Progress Notes (Signed)
10/17/2022 11:30 AM   Maudry Mayhew 08/05/1945 [id number]  Referring provider: Marisue Ivan, MD [phone] [facility] [address] [facility] [place],  [place] [address]  Chief Complaint  Patient presents with   Benign Prostatic Hypertrophy   Prostate Cancer    HPI: 77 year-old male with a personal history of BPH, low-risk prostate cancer, and urge incontinence returns today for annual follow-up.  His first prostate biopsy was done in June 2010 by Dr. Pascal Lux which was positive for ASAP.  He underwent repeat prostate biopsy in 2012, which was a 16 core biopsy. It showed ASAP as well as Gleason 3+3 prostate cancer in a single core at the right lateral mid.  Repeat prostate biopsy later that year showed similar findings.  Present biopsy 2014 was negative.  He had a prostate MRI in 2016 (vol 47,9 cc) which showed no evidence of disease.   His BPH is managed with Finasteride and Flomax.   He was previously on Toviaz but felt it was too expensive and wanted to try the generic form.  He also has a personal history of ED and has tried and failed PD-5 inhibitors.   His most recent PSA from 10/05/22 is stable at 1.3.  His urinalysis today is negative.   He reports the generic Gala Murdoch is helping but he still has some urgency. He has a few small accidents or leakage every few days. Denies burning and hematuria.   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   Results for orders placed or performed in visit on 10/17/22  Bladder Scan (Post Void Residual) in office  Result Value Ref Range   Scan Result 33 ml     PMH: Past Medical History:  Diagnosis Date   BPH (benign prostatic hyperplasia)    Diverticulosis    Dysplastic nevus 06/09/2021   right post deltoid- Severe, excised 07/19/21   GERD (gastroesophageal reflux disease)    GIB (gastrointestinal bleeding)    History of blood transfusion    History of SCC (squamous cell carcinoma) of skin 12/26/2017    left anterior shoulder   Hx of squamous cell carcinoma of skin 06/20/2017   R mid lateral forearm,   Macular hole    OAB (overactive bladder)    Sleep apnea    tested at [facility]   Spontaneous pneumothorax    Squamous cell carcinoma of skin 06/30/2017   right mid lat forearm   Squamous cell carcinoma of skin 04/16/2019   left post shoulder   Squamous cell carcinoma of skin 07/26/2021   right buccal cheek, [facility]   Tobacco abuse     Surgical History: Past Surgical History:  Procedure Laterality Date   25 GAUGE PARS PLANA VITRECTOMY WITH 20 GAUGE MVR PORT FOR MACULAR HOLE Left 04/21/2014   Procedure: 25 GAUGE PARS PLANA VITRECTOMY WITH 20 GAUGE MVR PORT FOR MACULAR HOLE;  Surgeon: Sherrie George, MD;  Location: [facility] OR;  Service: Ophthalmology;  Laterality: Left;   collapsed lung     back in 1968   COLONOSCOPY     CYST EXCISION     from his back x 2   GAS/FLUID EXCHANGE Left 04/21/2014   Procedure: GAS/FLUID EXCHANGE;  Surgeon: Sherrie George, MD;  Location: [facility] OR;  Service: Ophthalmology;  Laterality: Left;   MEMBRANE PEEL Left 04/21/2014   Procedure: MEMBRANE PEEL;  Surgeon: Sherrie George, MD;  Location: [facility] OR;  Service: Ophthalmology;  Laterality: Left;   SERUM PATCH  Left 04/21/2014   Procedure: SERUM PATCH;  Surgeon: Sherrie George, MD;  Location: [facility] OR;  Service: Ophthalmology;  Laterality: Left;    Home Medications:  Allergies as of 10/17/2022   No Known Allergies      Medication List        Accurate as of October 17, 2022 11:30 AM. If you have any questions, ask your nurse or doctor.          STOP taking these medications    hydrocortisone 2.5 % lotion   mupirocin ointment 2 % Commonly known as: BACTROBAN       TAKE these medications    fesoterodine 8 MG Tb24 tablet Commonly known as: TOVIAZ TAKE 1 TABLET EVERY DAY   finasteride 5 MG tablet Commonly known as: PROSCAR TAKE 1 TABLET (5 MG TOTAL) BY MOUTH DAILY.   lovastatin 20 MG tablet Commonly known  as: MEVACOR   Multi-Vitamin tablet   multivitamin-iron-minerals-folic acid Tabs tablet Take 1 tablet by mouth daily.   pantoprazole 40 MG tablet Commonly known as: PROTONIX   tamsulosin 0.4 MG Caps capsule Commonly known as: FLOMAX TAKE 1 CAPSULE (0.4 MG TOTAL) BY MOUTH DAILY.        Family History: Family History  Problem Relation Age of Onset   Leukemia Mother    Coronary artery disease Father    Peripheral vascular disease Brother     Social History:  reports that he has been smoking cigars. He has never used smokeless tobacco. He reports current alcohol use of about 14.0 standard drinks of alcohol per week. He reports that he does not use drugs.   Physical Exam: BP (!) 148/74   Pulse 79   Ht 5\' 11"  (1.803 m)   Wt 193 lb (87.5 kg)   BMI 26.92 kg/m   Constitutional:  Alert and oriented, No acute distress. HEENT:  AT, moist mucus membranes.  Trachea midline, no masses. Neurologic: Grossly intact, no focal deficits, moving all 4 extremities. Psychiatric: Normal mood and affect.  Assessment & Plan:    BPH with urinary frequency/ urge incontinence  - Remains stable of Finasteride, Flomax, and generic Toviaz. Will continue current regimen.  - Discussed as he ages, consider transitioning to beta 3 agonist. Right now he's satisfied.   - He's emptying well. His urinalysis is negative, all of which is reassuring  2. History of prostate cancer  - His PSA is low and stable. Deferred rectal exam today give his age and the stability. Plan on doing one next year.  Return in about 1 year (around 10/17/2023) for DRE, PSA, UA.  I have reviewed the above documentation for accuracy and completeness, and I agree with the above.   Vanna Scotland, MD   [facility] [address], [address] [place], [place] [address] [phone]

## 2022-10-17 NOTE — Progress Notes (Signed)
Patrick Shepherd presents for an office visit. BP today is __148/74_________. He is not on BP medication-patient states b/p usually is normal. Greater than 140/90. Provider  notified. Pt advised to__f/u with PCP if b/p continues to stay elevated____. Pt voiced understanding.

## 2022-10-18 DIAGNOSIS — R42 Dizziness and giddiness: Secondary | ICD-10-CM | POA: Diagnosis not present

## 2022-10-18 DIAGNOSIS — C410 Malignant neoplasm of bones of skull and face: Secondary | ICD-10-CM | POA: Diagnosis not present

## 2022-11-03 DIAGNOSIS — H8191 Unspecified disorder of vestibular function, right ear: Secondary | ICD-10-CM | POA: Diagnosis not present

## 2022-11-03 DIAGNOSIS — C410 Malignant neoplasm of bones of skull and face: Secondary | ICD-10-CM | POA: Diagnosis not present

## 2022-11-03 DIAGNOSIS — R2689 Other abnormalities of gait and mobility: Secondary | ICD-10-CM | POA: Diagnosis not present

## 2022-11-22 ENCOUNTER — Other Ambulatory Visit: Payer: Self-pay | Admitting: Urology

## 2022-11-22 DIAGNOSIS — N401 Enlarged prostate with lower urinary tract symptoms: Secondary | ICD-10-CM

## 2022-12-21 ENCOUNTER — Ambulatory Visit: Payer: Medicare HMO | Admitting: Dermatology

## 2022-12-21 DIAGNOSIS — Z7189 Other specified counseling: Secondary | ICD-10-CM

## 2022-12-21 DIAGNOSIS — L578 Other skin changes due to chronic exposure to nonionizing radiation: Secondary | ICD-10-CM | POA: Diagnosis not present

## 2022-12-21 DIAGNOSIS — L821 Other seborrheic keratosis: Secondary | ICD-10-CM | POA: Diagnosis not present

## 2022-12-21 DIAGNOSIS — L570 Actinic keratosis: Secondary | ICD-10-CM | POA: Diagnosis not present

## 2022-12-21 DIAGNOSIS — W908XXA Exposure to other nonionizing radiation, initial encounter: Secondary | ICD-10-CM

## 2022-12-21 DIAGNOSIS — M79672 Pain in left foot: Secondary | ICD-10-CM

## 2022-12-21 NOTE — Progress Notes (Signed)
   Follow-Up Visit   Subjective  Patrick Shepherd is a 77 y.o. male who presents for the following: noticed a spot at left ankle that has been there over a year. Also complains of pain in his left ankle going on for 3 months   The patient has spots, moles and lesions to be evaluated, some may be new or changing and the patient may have concern these could be cancer.  The following portions of the chart were reviewed this encounter and updated as appropriate: medications, allergies, medical history  Review of Systems:  No other skin or systemic complaints except as noted in HPI or Assessment and Plan.  Objective  Well appearing patient in no apparent distress; mood and affect are within normal limits. A focused examination was performed of the following areas: Left ankle , left heel   Relevant exam findings are noted in the Assessment and Plan.  left ankle x 1 Erythematous thin papules/macules with gritty scale.           Assessment & Plan   SEBORRHEIC KERATOSIS - Stuck-on, waxy, tan-brown papules and/or plaques  - Benign-appearing - Discussed benign etiology and prognosis. - Observe - Call for any changes  Pain in left heel Exam: clear at exam Treatment Plan: Patient reports some pain in left heel for 3 months Benign. Observe Recommend referral to podiatry to r/o plantar fascitis vs other  ACTINIC DAMAGE - chronic, secondary to cumulative UV radiation exposure/sun exposure over time - diffuse scaly erythematous macules with underlying dyspigmentation - Recommend daily broad spectrum sunscreen SPF 30+ to sun-exposed areas, reapply every 2 hours as needed.  - Recommend staying in the shade or wearing long sleeves, sun glasses (UVA+UVB protection) and wide brim hats (4-inch brim around the entire circumference of the hat). - Call for new or changing lesions.  Actinic keratosis left ankle x 1  Will recheck at next follow up  Photos today    Actinic keratoses  are precancerous spots that appear secondary to cumulative UV radiation exposure/sun exposure over time. They are chronic with expected duration over 1 year. A portion of actinic keratoses will progress to squamous cell carcinoma of the skin. It is not possible to reliably predict which spots will progress to skin cancer and so treatment is recommended to prevent development of skin cancer.  Recommend daily broad spectrum sunscreen SPF 30+ to sun-exposed areas, reapply every 2 hours as needed.  Recommend staying in the shade or wearing long sleeves, sun glasses (UVA+UVB protection) and wide brim hats (4-inch brim around the entire circumference of the hat). Call for new or changing lesions.  Destruction of lesion - left ankle x 1 Complexity: simple   Destruction method: cryotherapy   Informed consent: discussed and consent obtained   Timeout:  patient name, date of birth, surgical site, and procedure verified Lesion destroyed using liquid nitrogen: Yes   Region frozen until ice ball extended beyond lesion: Yes   Outcome: patient tolerated procedure well with no complications   Post-procedure details: wound care instructions given      Return for keep follow up at 06/20/23 .  IAsher Muir, CMA, am acting as scribe for Armida Sans, MD.  Documentation: I have reviewed the above documentation for accuracy and completeness, and I agree with the above.  Armida Sans, MD

## 2022-12-21 NOTE — Patient Instructions (Addendum)
Probable plantar fascitis will send podiatry referral     Cryotherapy Aftercare  Wash gently with soap and water everyday.   Apply Vaseline and Band-Aid daily until healed.       Due to recent changes in healthcare laws, you may see results of your pathology and/or laboratory studies on MyChart before the doctors have had a chance to review them. We understand that in some cases there may be results that are confusing or concerning to you. Please understand that not all results are received at the same time and often the doctors may need to interpret multiple results in order to provide you with the best plan of care or course of treatment. Therefore, we ask that you please give [place] 2 business days to thoroughly review all your results before contacting the office for clarification. Should we see a critical lab result, you will be contacted sooner.   If You Need Anything After Your Visit  If you have any questions or concerns for your doctor, please call our main line at [phone] and press option 4 to reach your doctor's medical assistant. If no one answers, please leave a voicemail as directed and we will return your call as soon as possible. Messages left after 4 pm will be answered the following business day.   You may also send [place] a message via MyChart. We typically respond to MyChart messages within 1-2 business days.  For prescription refills, please ask your pharmacy to contact our office. Our fax number is [phone].  If you have an urgent issue when the clinic is closed that cannot wait until the next business day, you can page your doctor at the number below.    Please note that while we do our best to be available for urgent issues outside of office hours, we are not available 24/7.   If you have an urgent issue and are unable to reach [place], you may choose to seek medical care at your doctor's office, retail clinic, urgent care center, or emergency room.  If you have a  medical emergency, please immediately call 911 or go to the emergency department.  Pager Numbers  - Dr. Gwen Pounds: [phone]  - Dr. Roseanne Reno: [phone]  In the event of inclement weather, please call our main line at [phone] for an update on the status of any delays or closures.  Dermatology Medication Tips: Please keep the boxes that topical medications come in in order to help keep track of the instructions about where and how to use these. Pharmacies typically print the medication instructions only on the boxes and not directly on the medication tubes.   If your medication is too expensive, please contact our office at [phone] option 4 or send [place] a message through MyChart.   We are unable to tell what your co-pay for medications will be in advance as this is different depending on your insurance coverage. However, we may be able to find a substitute medication at lower cost or fill out paperwork to get insurance to cover a needed medication.   If a prior authorization is required to get your medication covered by your insurance company, please allow [place] 1-2 business days to complete this process.  Drug prices often vary depending on where the prescription is filled and some pharmacies may offer cheaper prices.  The website www.goodrx.com contains coupons for medications through different pharmacies. The prices here do not account for what the cost may be with help from insurance (it may be  cheaper with your insurance), but the website can give you the price if you did not use any insurance.  - You can print the associated coupon and take it with your prescription to the pharmacy.  - You may also stop by our office during regular business hours and pick up a GoodRx coupon card.  - If you need your prescription sent electronically to a different pharmacy, notify our office through [facility] or by phone at [phone] option 4.     Si Usted Necesita Algo Despus  de Su Visita  Tambin puede enviarnos un mensaje a travs de Clinical cytogeneticist. Por lo general respondemos a los mensajes de MyChart en el transcurso de 1 a 2 das hbiles.  Para renovar recetas, por favor pida a su farmacia que se ponga en contacto con nuestra oficina. Annie Sable de fax es [employer] [phone].  Si tiene un asunto urgente cuando la clnica est cerrada y que no puede esperar hasta el siguiente da hbil, puede llamar/localizar a su doctor(a) al nmero que aparece a continuacin.   Por favor, tenga en cuenta que aunque hacemos todo lo posible para estar disponibles para asuntos urgentes fuera del horario de [place], no estamos disponibles las 24 horas del da, los 7 [address]  [address] de la [place].   Si tiene un problema urgente y no puede comunicarse con nosotros, puede optar por buscar atencin mdica  en el consultorio de su doctor(a), en una clnica privada, en un centro de atencin urgente o en una sala de emergencias.  Si tiene Engineer, drilling, por favor llame inmediatamente al 911 o vaya a la sala de emergencias.  Nmeros de bper  - Dr. Gwen Pounds: [phone]  - Dra. Roseanne Reno: [phone]  En caso de inclemencias del [place], por favor llame a Lacy Duverney principal al [phone] para una actualizacin sobre el [place] de cualquier retraso o cierre.  Consejos para la medicacin en dermatologa: Por favor, guarde las cajas en las que vienen los medicamentos de uso tpico para ayudarle a seguir las instrucciones sobre dnde y cmo usarlos. Las farmacias generalmente imprimen las instrucciones del medicamento slo en las cajas y no directamente en los tubos del [place].   Si su medicamento es muy caro, por favor, pngase en contacto con Rolm Gala llamando al [phone] y presione la opcin 4 o envenos un mensaje a travs de Clinical cytogeneticist.   No podemos decirle cul ser su copago por los medicamentos por adelantado ya que esto es diferente dependiendo de la cobertura de su  seguro. Sin embargo, es posible que podamos encontrar un medicamento sustituto a Audiological scientist un formulario para que el seguro cubra el medicamento que se considera necesario.   Si se requiere una autorizacin previa para que su compaa de seguros [place] su medicamento, por favor permtanos de 1 a 2 das hbiles para completar [address]  Los precios de los medicamentos varan con frecuencia dependiendo del Environmental consultant de dnde se surte la receta y alguna farmacias pueden ofrecer precios ms baratos.  El sitio web www.goodrx.com tiene cupones para medicamentos de Health and safety inspector. Los precios aqu no tienen en cuenta lo que podra costar con la ayuda del seguro (puede ser ms barato con su seguro), pero el sitio web puede darle el precio si no utiliz Tourist information centre manager.  - Puede imprimir el cupn correspondiente y llevarlo con su receta a la farmacia.  - Tambin puede pasar por nuestra oficina durante el horario de atencin regular y Education officer, museum una tarjeta de cupones de  GoodRx.  - Si necesita que su receta se enve electrnicamente a una farmacia diferente, informe a nuestra oficina a travs de MyChart de [place] o por telfono llamando al [phone] y presione la opcin 4.

## 2022-12-25 DIAGNOSIS — H35379 Puckering of macula, unspecified eye: Secondary | ICD-10-CM | POA: Diagnosis not present

## 2022-12-25 DIAGNOSIS — H35373 Puckering of macula, bilateral: Secondary | ICD-10-CM | POA: Diagnosis not present

## 2022-12-25 DIAGNOSIS — Z0100 Encounter for examination of eyes and vision without abnormal findings: Secondary | ICD-10-CM | POA: Diagnosis not present

## 2022-12-31 ENCOUNTER — Encounter: Payer: Self-pay | Admitting: Dermatology

## 2023-01-16 DIAGNOSIS — E782 Mixed hyperlipidemia: Secondary | ICD-10-CM | POA: Diagnosis not present

## 2023-01-22 DIAGNOSIS — E782 Mixed hyperlipidemia: Secondary | ICD-10-CM | POA: Diagnosis not present

## 2023-01-22 DIAGNOSIS — N289 Disorder of kidney and ureter, unspecified: Secondary | ICD-10-CM | POA: Diagnosis not present

## 2023-01-22 DIAGNOSIS — G4733 Obstructive sleep apnea (adult) (pediatric): Secondary | ICD-10-CM | POA: Diagnosis not present

## 2023-01-22 DIAGNOSIS — K219 Gastro-esophageal reflux disease without esophagitis: Secondary | ICD-10-CM | POA: Diagnosis not present

## 2023-02-21 DIAGNOSIS — C410 Malignant neoplasm of bones of skull and face: Secondary | ICD-10-CM | POA: Diagnosis not present

## 2023-02-21 DIAGNOSIS — Z923 Personal history of irradiation: Secondary | ICD-10-CM | POA: Diagnosis not present

## 2023-04-17 DIAGNOSIS — N289 Disorder of kidney and ureter, unspecified: Secondary | ICD-10-CM | POA: Diagnosis not present

## 2023-04-17 DIAGNOSIS — K219 Gastro-esophageal reflux disease without esophagitis: Secondary | ICD-10-CM | POA: Diagnosis not present

## 2023-04-17 DIAGNOSIS — E782 Mixed hyperlipidemia: Secondary | ICD-10-CM | POA: Diagnosis not present

## 2023-04-24 DIAGNOSIS — E782 Mixed hyperlipidemia: Secondary | ICD-10-CM | POA: Diagnosis not present

## 2023-04-24 DIAGNOSIS — G4733 Obstructive sleep apnea (adult) (pediatric): Secondary | ICD-10-CM | POA: Diagnosis not present

## 2023-04-24 DIAGNOSIS — K219 Gastro-esophageal reflux disease without esophagitis: Secondary | ICD-10-CM | POA: Diagnosis not present

## 2023-05-19 ENCOUNTER — Other Ambulatory Visit: Payer: Self-pay | Admitting: Urology

## 2023-06-03 ENCOUNTER — Ambulatory Visit
Admission: EM | Admit: 2023-06-03 | Discharge: 2023-06-03 | Disposition: A | Discharge status: Home / Self Care | Payer: Medicare HMO

## 2023-06-03 ENCOUNTER — Encounter: Payer: Self-pay | Admitting: Emergency Medicine

## 2023-06-03 DIAGNOSIS — J069 Acute upper respiratory infection, unspecified: Secondary | ICD-10-CM | POA: Diagnosis not present

## 2023-06-03 MED ORDER — AMOXICILLIN-POT CLAVULANATE 875-125 MG PO TABS: 1.0000 | ORAL_TABLET | Freq: Two times a day (BID) | ORAL | 0 refills | Status: DC

## 2023-06-03 MED ORDER — IPRATROPIUM BROMIDE 0.03 % NA SOLN: 2.0000 | Freq: Two times a day (BID) | NASAL | 0 refills | Status: DC

## 2023-06-03 NOTE — Discharge Instructions (Signed)
Begin Augmentin every morning and every evening for 7 days to clear bacteria leading to your symptoms  You may use nasal spray every morning and every evening to help clear out congestion from the sinuses    You can take Tylenol and/or Ibuprofen as needed for fever reduction and pain relief.   For cough: honey 1/2 to 1 teaspoon (you can dilute the honey in water or another fluid).  You can also use guaifenesin and dextromethorphan for cough. You can use a humidifier for chest congestion and cough.  If you don't have a humidifier, you can sit in the bathroom with the hot shower running.      For sore throat: try warm salt water gargles, cepacol lozenges, throat spray, warm tea or water with lemon/honey, popsicles or ice, or OTC cold relief medicine for throat discomfort.   For congestion: take a daily anti-histamine like Zyrtec, Claritin, and a oral decongestant, such as pseudoephedrine.  You can also use Flonase 1-2 sprays in each nostril daily.   It is important to stay hydrated: drink plenty of fluids (water, gatorade/powerade/pedialyte, juices, or teas) to keep your throat moisturized and help further relieve irritation/discomfort.

## 2023-06-03 NOTE — ED Triage Notes (Signed)
Pt c/o sore throat, head congestion and dark colored mucus x 1 week. Pt has tried OTC medication with no relief.

## 2023-06-03 NOTE — ED Provider Notes (Signed)
Patrick Shepherd    CSN: [id number] Arrival date & time: 06/03/23  1143      History   Chief Complaint Chief Complaint  Patient presents with   Sore Throat   Nasal Congestion    HPI Patrick Shepherd is a 78 y.o. male.   Patient presents for evaluation of head congestion, nasal congestion, sinus pressure along the forehead and cheeks and a sore throat present for 7 days.  Nasal congestion has changed in color from clear to a green to brown to black.  Denies shortness of breath, wheezing.  No known sick contact prior.  Has attempted use of Alka-Seltzer and Mucinex.  Past Medical History:  Diagnosis Date   BPH (benign prostatic hyperplasia)    Diverticulosis    Dysplastic nevus 06/09/2021   right post deltoid- Severe, excised 07/19/21   GERD (gastroesophageal reflux disease)    GIB (gastrointestinal bleeding)    History of blood transfusion    History of SCC (squamous cell carcinoma) of skin 12/26/2017   left anterior shoulder   Hx of squamous cell carcinoma of skin 06/20/2017   R mid lateral forearm,   Macular hole    OAB (overactive bladder)    Sleep apnea    [facility]   Spontaneous pneumothorax    Squamous cell carcinoma of skin 06/30/2017   right mid lat forearm   Squamous cell carcinoma of skin 04/16/2019   left post shoulder   Squamous cell carcinoma of skin 07/26/2021   right buccal cheek, EDC   Tobacco abuse     Patient Active Problem List   Diagnosis Date Noted   Rectal bleeding 11/24/2015   Chest pain 11/24/2015   Perineal abscess 11/24/2015   GIB (gastrointestinal bleeding) 11/24/2015   BPH (benign prostatic hyperplasia)    GERD (gastroesophageal reflux disease)    Sleep apnea    Diverticulosis    Macular hole, left eye 04/21/2014   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, 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    Past Surgical History:  Procedure Laterality Date   25 GAUGE PARS PLANA VITRECTOMY WITH 20 GAUGE MVR PORT FOR MACULAR HOLE Left 04/21/2014    Procedure: 25 GAUGE PARS PLANA VITRECTOMY WITH 20 GAUGE MVR PORT FOR MACULAR HOLE;  Surgeon: Sherrie George, MD;  Location[facility] OR;  Service: Ophthalmology;  Laterality: Left;   collapsed lung     back in 1968   COLONOSCOPY     CYST EXCISION     from his back x 2   GAS/FLUID EXCHANGE Left 04/21/2014   Procedure: GAS/FLUID EXCHANGE;  Surgeon: Sherrie George, MD;  Location[facility] OR;  Service: Ophthalmology;  Laterality: Left;   MEMBRANE PEEL Left 04/21/2014   Procedure: MEMBRANE PEEL;  Surgeon: Sherrie George, MD;  Location: [facility] OR;  Service: Ophthalmology;  Laterality: Left;   SERUM PATCH Left 04/21/2014   Procedure: SERUM PATCH;  Surgeon: Sherrie George, MD;  [facility] OR;  Service: Ophthalmology;  Laterality: Left;       Home Medications    Prior to Admission medications   Medication Sig Start Date End Date Taking? Authorizing Provider  amoxicillin-clavulanate (AUGMENTIN) 875-125 MG tablet Take 1 tablet by mouth every 12 (twelve) hours. 06/03/23  Yes Theodoro Koval R, NP  ipratropium (ATROVENT) 0.03 % nasal spray Place 2 sprays into both nostrils every 12 (twelve) hours. 06/03/23  Yes Alizza Sacra R, NP  famotidine (PEPCID) 20 MG tablet Take 20 mg by mouth 2 (two) times daily.  [provider]  fesoterodine (TOVIAZ) 8 MG TB24 tablet TAKE 1 TABLET EVERY DAY 11/22/22   Vanna Scotland, MD  finasteride (PROSCAR) 5 MG tablet TAKE 1 TABLET EVERY DAY 05/21/23   Vanna Scotland, MD  lovastatin (MEVACOR) 20 MG tablet  12/07/20   [provider]  Multiple Vitamin (MULTI-VITAMIN) tablet  03/25/08   [provider]  multivitamin-iron-minerals-folic acid (THERAPEUTIC-M) TABS tablet Take 1 tablet by mouth daily.    [provider]  pantoprazole (PROTONIX) 40 MG tablet  12/08/20   [provider]  pentoxifylline (TRENTAL) 400 MG CR tablet Take 400 mg by mouth 2 (two) times daily.    [provider]  tamsulosin (FLOMAX) 0.4 MG CAPS capsule TAKE 1  CAPSULE EVERY DAY 05/21/23   Vanna Scotland, MD    Family History Family History  Problem Relation Age of Onset   Leukemia Mother    Coronary artery disease Father    Peripheral vascular disease Brother     Social History Social History   Tobacco Use   Smoking status: Former    Types: Cigars   Smokeless tobacco: Never   Tobacco comments:    smokes 2 a day  Vaping Use   Vaping status: Never Used  Substance Use Topics   Alcohol use: Yes    Alcohol/week: 14.0 standard drinks of alcohol    Types: 14 Shots of liquor per week   Drug use: No     Allergies   Patient has no known allergies.   Review of Systems Review of Systems   Physical Exam Triage Vital Signs ED Triage Vitals  Encounter Vitals Group     BP 06/03/23 1206 124/78     Systolic BP Percentile --      Diastolic BP Percentile --      Pulse Rate 06/03/23 1206 (!) 102     Resp 06/03/23 1206 16     Temp 06/03/23 1206 99.1 F (37.3 C)     Temp Source 06/03/23 1206 Oral     SpO2 06/03/23 1206 94 %     Weight --      Height --      Head Circumference --      Peak Flow --      Pain Score 06/03/23 1204 0     Pain Loc --      Pain Education --      Exclude from Growth Chart --    No data found.  Updated Vital Signs BP 124/78 (BP Location: Left Arm)   Pulse (!) 102   Temp 99.1 F (37.3 C) (Oral)   Resp 16   SpO2 94%   Visual Acuity Right Eye Distance:   Left Eye Distance:   Bilateral Distance:    Right Eye Near:   Left Eye Near:    Bilateral Near:     Physical Exam Constitutional:      Appearance: Normal appearance.  HENT:     Head: Normocephalic.     Right Ear: Tympanic membrane, ear canal and external ear normal.     Left Ear: Tympanic membrane, ear canal and external ear normal.     Nose: Congestion present. No rhinorrhea.     Mouth/Throat:     Pharynx: Posterior oropharyngeal erythema present. No oropharyngeal exudate.  Eyes:     Extraocular Movements: Extraocular movements intact.   Cardiovascular:     Rate and Rhythm: Normal rate and regular rhythm.     Pulses: Normal pulses.  Heart sounds: Normal heart sounds.  Pulmonary:     Effort: Pulmonary effort is normal.     Breath sounds: Normal breath sounds.  Musculoskeletal:     Cervical back: Normal range of motion and neck supple.  Neurological:     Mental Status: He is alert and oriented to person, place, and time. Mental status is at baseline.      UC Treatments / Results  Labs (all labs ordered are listed, but only abnormal results are displayed) Labs Reviewed - No data to display  EKG   Radiology No results found.  Procedures Procedures (including critical care time)  Medications Ordered in UC Medications - No data to display  Initial Impression / Assessment and Plan / UC Course  I have reviewed the triage vital signs and the nursing notes.  Pertinent labs & imaging results that were available during my care of the patient were reviewed by me and considered in my medical decision making (see chart for details).  Acute URI  Patient is in no signs of distress nor toxic appearing.  Vital signs are stable.  Low suspicion for pneumonia, pneumothorax or bronchitis and therefore will defer imaging.  Viral testing deferred due to timeline of illness.  Symptoms present for 7 days without signs of resolution will provide bacterial coverage.  Prescribed Augmentin and Atrovent nasal spray for sinuses.May use additional over-the-counter medications as needed for supportive care.  May follow-up with urgent care as needed if symptoms persist or worsen.   Final Clinical Impressions(s) / UC Diagnoses   Final diagnoses:  Acute URI     Discharge Instructions      Begin Augmentin every morning and every evening for 7 days to clear bacteria leading to your symptoms  You may use nasal spray every morning and every evening to help clear out congestion from the sinuses    You can take Tylenol and/or  Ibuprofen as needed for fever reduction and pain relief.   For cough: honey 1/2 to 1 teaspoon (you can dilute the honey in water or another fluid).  You can also use guaifenesin and dextromethorphan for cough. You can use a humidifier for chest congestion and cough.  If you don't have a humidifier, you can sit in the bathroom with the hot shower running.      For sore throat: try warm salt water gargles, cepacol lozenges, throat spray, warm tea or water with lemon/honey, popsicles or ice, or OTC cold relief medicine for throat discomfort.   For congestion: take a daily anti-histamine like Zyrtec, Claritin, and a oral decongestant, such as pseudoephedrine.  You can also use Flonase 1-2 sprays in each nostril daily.   It is important to stay hydrated: drink plenty of fluids (water, gatorade/powerade/pedialyte, juices, or teas) to keep your throat moisturized and help further relieve irritation/discomfort.    ED Prescriptions     Medication Sig Dispense Auth. Provider   amoxicillin-clavulanate (AUGMENTIN) 875-125 MG tablet Take 1 tablet by mouth every 12 (twelve) hours. 14 tablet Maxamilian Amadon R, NP   ipratropium (ATROVENT) 0.03 % nasal spray Place 2 sprays into both nostrils every 12 (twelve) hours. 30 mL Valinda Hoar, NP      PDMP not reviewed this encounter.   Valinda Hoar, NP 06/03/23 1246

## 2023-06-20 ENCOUNTER — Ambulatory Visit: Payer: Medicare HMO | Admitting: Dermatology

## 2023-06-22 DIAGNOSIS — H35379 Puckering of macula, unspecified eye: Secondary | ICD-10-CM | POA: Diagnosis not present

## 2023-06-22 DIAGNOSIS — H26493 Other secondary cataract, bilateral: Secondary | ICD-10-CM | POA: Diagnosis not present

## 2023-06-22 DIAGNOSIS — H35373 Puckering of macula, bilateral: Secondary | ICD-10-CM | POA: Diagnosis not present

## 2023-06-27 DIAGNOSIS — C410 Malignant neoplasm of bones of skull and face: Secondary | ICD-10-CM | POA: Diagnosis not present

## 2023-06-27 DIAGNOSIS — Z923 Personal history of irradiation: Secondary | ICD-10-CM | POA: Diagnosis not present

## 2023-07-17 DIAGNOSIS — Z85830 Personal history of malignant neoplasm of bone: Secondary | ICD-10-CM | POA: Diagnosis not present

## 2023-07-17 DIAGNOSIS — C410 Malignant neoplasm of bones of skull and face: Secondary | ICD-10-CM | POA: Diagnosis not present

## 2023-07-17 DIAGNOSIS — R918 Other nonspecific abnormal finding of lung field: Secondary | ICD-10-CM | POA: Diagnosis not present

## 2023-07-17 DIAGNOSIS — Z923 Personal history of irradiation: Secondary | ICD-10-CM | POA: Diagnosis not present

## 2023-07-17 DIAGNOSIS — Z08 Encounter for follow-up examination after completed treatment for malignant neoplasm: Secondary | ICD-10-CM | POA: Diagnosis not present

## 2023-07-24 ENCOUNTER — Ambulatory Visit (INDEPENDENT_AMBULATORY_CARE_PROVIDER_SITE_OTHER): Payer: Medicare HMO | Admitting: Dermatology

## 2023-07-24 ENCOUNTER — Encounter: Payer: Self-pay | Admitting: Dermatology

## 2023-07-24 DIAGNOSIS — L720 Epidermal cyst: Secondary | ICD-10-CM

## 2023-07-24 DIAGNOSIS — L578 Other skin changes due to chronic exposure to nonionizing radiation: Secondary | ICD-10-CM

## 2023-07-24 DIAGNOSIS — L219 Seborrheic dermatitis, unspecified: Secondary | ICD-10-CM | POA: Diagnosis not present

## 2023-07-24 DIAGNOSIS — Z1283 Encounter for screening for malignant neoplasm of skin: Secondary | ICD-10-CM | POA: Diagnosis not present

## 2023-07-24 DIAGNOSIS — Z85828 Personal history of other malignant neoplasm of skin: Secondary | ICD-10-CM

## 2023-07-24 DIAGNOSIS — D1801 Hemangioma of skin and subcutaneous tissue: Secondary | ICD-10-CM

## 2023-07-24 DIAGNOSIS — L821 Other seborrheic keratosis: Secondary | ICD-10-CM

## 2023-07-24 DIAGNOSIS — D492 Neoplasm of unspecified behavior of bone, soft tissue, and skin: Secondary | ICD-10-CM | POA: Diagnosis not present

## 2023-07-24 DIAGNOSIS — L570 Actinic keratosis: Secondary | ICD-10-CM

## 2023-07-24 DIAGNOSIS — D229 Melanocytic nevi, unspecified: Secondary | ICD-10-CM

## 2023-07-24 DIAGNOSIS — D485 Neoplasm of uncertain behavior of skin: Secondary | ICD-10-CM

## 2023-07-24 DIAGNOSIS — L814 Other melanin hyperpigmentation: Secondary | ICD-10-CM

## 2023-07-24 DIAGNOSIS — L853 Xerosis cutis: Secondary | ICD-10-CM

## 2023-07-24 DIAGNOSIS — W908XXA Exposure to other nonionizing radiation, initial encounter: Secondary | ICD-10-CM

## 2023-07-24 MED ORDER — HYDROCORTISONE 2.5 % EX CREA: TOPICAL_CREAM | CUTANEOUS | 5 refills | Status: AC

## 2023-07-24 MED ORDER — KETOCONAZOLE 2 % EX CREA: TOPICAL_CREAM | CUTANEOUS | 5 refills | Status: AC

## 2023-07-24 NOTE — Progress Notes (Signed)
 Follow-Up Visit   Subjective  Patrick Shepherd is a 78 y.o. male who presents for the following: Skin Cancer Screening and Full Body Skin Exam  The patient presents for Total-Body Skin Exam (TBSE) for skin cancer screening and mole check. The patient has spots, moles and lesions to be evaluated, some may be new or changing and the patient may have concern these could be cancer.  Previously tx AK on the L ankle did not resolve or improve with LN2.  The following portions of the chart were reviewed this encounter and updated as appropriate: medications, allergies, medical history  Review of Systems:  No other skin or systemic complaints except as noted in HPI or Assessment and Plan.  Objective  Well appearing patient in no apparent distress; mood and affect are within normal limits.  A full examination was performed including scalp, head, eyes, ears, nose, lips, neck, chest, axillae, abdomen, back, buttocks, bilateral upper extremities, bilateral lower extremities, hands, feet, fingers, toes, fingernails, and toenails. All findings within normal limits unless otherwise noted below.   Relevant physical exam findings are noted in the Assessment and Plan.  L lat malleolus Pink vascular patch with central scar.  L frontal scalp at hair part x 1, L med cheek x 1, L sup helix x 1, sup nasal dorsum x 1, R upper arm x 1, L sup chest x 1 (6) Erythematous thin papules/macules with gritty scale.   Assessment & Plan   SKIN CANCER SCREENING PERFORMED TODAY.  ACTINIC DAMAGE - Chronic condition, secondary to cumulative UV/sun exposure - diffuse scaly erythematous macules with underlying dyspigmentation - Recommend daily broad spectrum sunscreen SPF 30+ to sun-exposed areas, reapply every 2 hours as needed.  - Staying in the shade or wearing long sleeves, sun glasses (UVA+UVB protection) and wide brim hats (4-inch brim around the entire circumference of the hat) are also recommended for sun  protection.  - Call for new or changing lesions.  LENTIGINES, SEBORRHEIC KERATOSES, HEMANGIOMAS - Benign normal skin lesions - Benign-appearing - Call for any changes  MELANOCYTIC NEVI - Tan-brown and/or pink-flesh-colored symmetric macules and papules - Benign appearing on exam today - Observation - Call clinic for new or changing moles - Recommend daily use of broad spectrum spf 30+ sunscreen to sun-exposed areas.   HISTORY OF SQUAMOUS CELL CARCINOMA OF THE SKIN - No evidence of recurrence today - No lymphadenopathy - Recommend regular full body skin exams - Recommend daily broad spectrum sunscreen SPF 30+ to sun-exposed areas, reapply every 2 hours as needed.  - Call if any new or changing lesions are noted between office visits  HISTORY OF DYSPLASTIC NEVI No evidence of recurrence today Recommend regular full body skin exams Recommend daily broad spectrum sunscreen SPF 30+ to sun-exposed areas, reapply every 2 hours as needed.  Call if any new or changing lesions are noted between office visits  SEBORRHEIC DERMATITIS Exam: Pink patches with greasy scale at the glabella eyebrows alar creases scalp and beard area.  Chronic and persistent condition with duration or expected duration over one year. Condition is bothersome/symptomatic for patient. Currently flared.  Seborrheic Dermatitis is a chronic persistent rash characterized by pinkness and scaling most commonly of the mid face but also can occur on the scalp (dandruff), ears; mid chest, mid back and groin.  It tends to be exacerbated by stress and cooler weather.  People who have neurologic disease may experience new onset or exacerbation of existing seborrheic dermatitis.  The condition is not  curable but treatable and can be controlled.  Treatment Plan: Restart HC 2.5% cream to aa's QD-BID PRN and Ketoconazole 2% cream QD.   NEOPLASM OF UNCERTAIN BEHAVIOR OF SKIN L lat malleolus Likely from cryotherapy - benign appearing  under dermoscopy, discussed biopsy, pt defers today. ACTINIC KERATOSIS (6) L frontal scalp at hair part x 1, L med cheek x 1, L sup helix x 1, sup nasal dorsum x 1, R upper arm x 1, L sup chest x 1 (6) Will recheck at next follow up  Photos today    Actinic keratoses are precancerous spots that appear secondary to cumulative UV radiation exposure/sun exposure over time. They are chronic with expected duration over 1 year. A portion of actinic keratoses will progress to squamous cell carcinoma of the skin. It is not possible to reliably predict which spots will progress to skin cancer and so treatment is recommended to prevent development of skin cancer.  Recommend daily broad spectrum sunscreen SPF 30+ to sun-exposed areas, reapply every 2 hours as needed.  Recommend staying in the shade or wearing long sleeves, sun glasses (UVA+UVB protection) and wide brim hats (4-inch brim around the entire circumference of the hat). Call for new or changing lesions. Destruction of lesion - L frontal scalp at hair part x 1, L med cheek x 1, L sup helix x 1, sup nasal dorsum x 1, R upper arm x 1, L sup chest x 1 (6) Complexity: simple   Destruction method: cryotherapy   Informed consent: discussed and consent obtained   Timeout:  patient name, date of birth, surgical site, and procedure verified Lesion destroyed using liquid nitrogen: Yes   Region frozen until ice ball extended beyond lesion: Yes   Outcome: patient tolerated procedure well with no complications   Post-procedure details: wound care instructions given   MULTIPLE BENIGN NEVI   LENTIGINES   ACTINIC ELASTOSIS   SEBORRHEIC KERATOSES   CHERRY ANGIOMA   EIC (EPIDERMAL INCLUSION CYST)   XEROSIS CUTIS   SEBORRHEIC DERMATITIS    Xerosis - diffuse xerotic patches - recommend gentle, hydrating skin care - gentle skin care handout given  EPIDERMAL INCLUSION CYST Exam: superficial subcutaneous nodules B/L post  auricular  Benign-appearing. Exam most consistent with an epidermal inclusion cyst. Discussed that a cyst is a benign growth that can grow over time and sometimes get irritated or inflamed. Recommend observation if it is not bothersome. Discussed option of surgical excision to remove it if it is growing, symptomatic, or other changes noted. Please call for new or changing lesions so they can be evaluated.  Return in about 1 year (around 07/23/2024) for TBSE.  Maylene Roes, CMA, am acting as scribe for Elie Goody, MD .   Documentation: I have reviewed the above documentation for accuracy and completeness, and I agree with the above.  Elie Goody, MD

## 2023-07-24 NOTE — Patient Instructions (Addendum)
 Gentle Skin Care Guide  1. Bathe no more than once a day.  2. Avoid bathing in hot water  3. Use a mild soap like Dove, Vanicream, Cetaphil, CeraVe. Can use Lever 2000 or Cetaphil antibacterial soap  4. Use soap only where you need it. On most days, use it under your arms, between your legs, and on your feet. Let the water rinse other areas unless visibly dirty.  5. When you get out of the bath/shower, use a towel to gently blot your skin dry, don't rub it.  6. While your skin is still a little damp, apply a moisturizing cream such as Vanicream, CeraVe, Cetaphil, Eucerin, Sarna lotion or plain Vaseline Jelly. For hands apply Neutrogena [place] Hand Cream or Excipial Hand Cream.  7. Reapply moisturizer any time you start to itch or feel dry.  8. Sometimes using free and clear laundry detergents can be helpful. Fabric softener sheets should be avoided. Downy Free & Gentle liquid, or any liquid fabric softener that is free of dyes and perfumes, it acceptable to use  9. If your doctor has given you prescription creams you may apply moisturizers over them      Due to recent changes in healthcare laws, you may see results of your pathology and/or laboratory studies on MyChart before the doctors have had a chance to review them. We understand that in some cases there may be results that are confusing or concerning to you. Please understand that not all results are received at the same time and often the doctors may need to interpret multiple results in order to provide you with the best plan of care or course of treatment. Therefore, we ask that you please give [place] 2 business days to thoroughly review all your results before contacting the office for clarification. Should we see a critical lab result, you will be contacted sooner.   If You Need Anything After Your Visit  If you have any questions or concerns for your doctor, please call our main line at [phone] and press option 4 to reach  your doctor's medical assistant. If no one answers, please leave a voicemail as directed and we will return your call as soon as possible. Messages left after 4 pm will be answered the following business day.   You may also send [place] a message via MyChart. We typically respond to MyChart messages within 1-2 business days.  For prescription refills, please ask your pharmacy to contact our office. Our fax number is [phone].  If you have an urgent issue when the clinic is closed that cannot wait until the next business day, you can page your doctor at the number below.    Please note that while we do our best to be available for urgent issues outside of office hours, we are not available 24/7.   If you have an urgent issue and are unable to reach [place], you may choose to seek medical care at your doctor's office, retail clinic, urgent care center, or emergency room.  If you have a medical emergency, please immediately call 911 or go to the emergency department.  Pager Numbers  - Dr. Gwen Pounds: [phone]  - Dr. Roseanne Reno: [phone]  - Dr. Katrinka Blazing: [phone]   In the event of inclement weather, please call our main line at [phone] for an update on the status of any delays or closures.  Dermatology Medication Tips: Please keep the boxes that topical medications come in in order to help keep track of the instructions  about where and how to use these. Pharmacies typically print the medication instructions only on the boxes and not directly on the medication tubes.   If your medication is too expensive, please contact our office at [phone] option 4 or send [place] a message through MyChart.   We are unable to tell what your co-pay for medications will be in advance as this is different depending on your insurance coverage. However, we may be able to find a substitute medication at lower cost or fill out paperwork to get insurance to cover a needed medication.   If a prior authorization  is required to get your medication covered by your insurance company, please allow [place] 1-2 business days to complete this process.  Drug prices often vary depending on where the prescription is filled and some pharmacies may offer cheaper prices.  The website www.goodrx.com contains coupons for medications through different pharmacies. The prices here do not account for what the cost may be with help from insurance (it may be cheaper with your insurance), but the website can give you the price if you did not use any insurance.  - You can print the associated coupon and take it with your prescription to the pharmacy.  - You may also stop by our office during regular business hours and pick up a GoodRx coupon card.  - If you need your prescription sent electronically to a different pharmacy, notify our office through [facility] or by phone at [phone] option 4.     Si Usted Necesita Algo Despus de Su Visita  Tambin puede enviarnos un mensaje a travs de Clinical cytogeneticist. Por lo general respondemos a los mensajes de MyChart en el transcurso de 1 a 2 das hbiles.  Para renovar recetas, por favor pida a su farmacia que se ponga en contacto con nuestra oficina. Annie Sable de fax es [place] [phone].  Si tiene un asunto urgente cuando la clnica est cerrada y que no puede esperar hasta el siguiente da hbil, puede llamar/localizar a su doctor(a) al nmero que aparece a continuacin.   Por favor, tenga en cuenta que aunque hacemos todo lo posible para estar disponibles para asuntos urgentes fuera del horario de [place], no estamos disponibles las 24 horas del da, los 7 [address]  [address] de la [place].   Si tiene un problema urgente y no puede comunicarse con nosotros, puede optar por buscar atencin mdica  en el consultorio de su doctor(a), en una clnica privada, en un centro de atencin urgente o en una sala de emergencias.  Si tiene Engineer, drilling, por favor llame inmediatamente al 911 o  vaya a la sala de emergencias.  Nmeros de bper  - Dr. Gwen Pounds: [phone]  - Dra. Roseanne Reno: [phone]  - Dr. Katrinka Blazing: [phone]   En caso de inclemencias del tiempo, por favor llame a Lacy Duverney principal al [phone] para una actualizacin sobre el [place] de cualquier retraso o cierre.  Consejos para la medicacin en dermatologa: Por favor, guarde las cajas en las que vienen los medicamentos de uso tpico para ayudarle a seguir las instrucciones sobre dnde y cmo usarlos. Las farmacias generalmente imprimen las instrucciones del medicamento slo en las cajas y no directamente en los tubos del [place].   Si su medicamento es muy caro, por favor, pngase en contacto con Rolm Gala llamando al [phone] y presione la opcin 4 o envenos un mensaje a travs de Clinical cytogeneticist.   No podemos decirle cul ser su copago por los medicamentos por adelantado ya  que esto es diferente dependiendo de la cobertura de su seguro. Sin embargo, es posible que podamos encontrar un medicamento sustituto a Audiological scientist un formulario para que el seguro cubra el medicamento que se considera necesario.   Si se requiere una autorizacin previa para que su compaa de seguros [place] su medicamento, por favor permtanos de 1 a 2 das hbiles para completar [address]  Los precios de los medicamentos varan con frecuencia dependiendo del Environmental consultant de dnde se surte la receta y alguna farmacias pueden ofrecer precios ms baratos.  El sitio web www.goodrx.com tiene cupones para medicamentos de Health and safety inspector. Los precios aqu no tienen en cuenta lo que podra costar con la ayuda del seguro (puede ser ms barato con su seguro), pero el sitio web puede darle el precio si no utiliz Tourist information centre manager.  - Puede imprimir el cupn correspondiente y llevarlo con su receta a la farmacia.  - Tambin puede pasar por nuestra oficina durante el horario de atencin regular y Education officer, museum una tarjeta de cupones  de GoodRx.  - Si necesita que su receta se enve electrnicamente a una farmacia diferente, informe a nuestra oficina a travs de MyChart de [place] o por telfono llamando al [phone] y presione la opcin 4.

## 2023-10-11 ENCOUNTER — Other Ambulatory Visit: Payer: Self-pay

## 2023-10-11 DIAGNOSIS — R350 Frequency of micturition: Secondary | ICD-10-CM | POA: Diagnosis not present

## 2023-10-11 DIAGNOSIS — C61 Malignant neoplasm of prostate: Secondary | ICD-10-CM | POA: Diagnosis not present

## 2023-10-11 DIAGNOSIS — N401 Enlarged prostate with lower urinary tract symptoms: Secondary | ICD-10-CM

## 2023-10-12 LAB — PSA: Prostate Specific Ag, Serum: 1.1 ng/mL (ref 0.0–4.0)

## 2023-10-16 ENCOUNTER — Ambulatory Visit (INDEPENDENT_AMBULATORY_CARE_PROVIDER_SITE_OTHER): Payer: Self-pay | Admitting: Urology

## 2023-10-16 VITALS — BP 165/80 | HR 96 | Ht 72.0 in | Wt 182.0 lb

## 2023-10-16 DIAGNOSIS — C61 Malignant neoplasm of prostate: Secondary | ICD-10-CM

## 2023-10-16 DIAGNOSIS — N401 Enlarged prostate with lower urinary tract symptoms: Secondary | ICD-10-CM

## 2023-10-16 DIAGNOSIS — R350 Frequency of micturition: Secondary | ICD-10-CM

## 2023-10-16 LAB — URINALYSIS, COMPLETE
Bilirubin, UA: NEGATIVE
Glucose, UA: NEGATIVE
Ketones, UA: NEGATIVE
Leukocytes,UA: NEGATIVE
Nitrite, UA: NEGATIVE
Protein,UA: NEGATIVE
RBC, UA: NEGATIVE
Specific Gravity, UA: 1.03 (ref 1.005–1.030)
Urobilinogen, Ur: 0.2 mg/dL (ref 0.2–1.0)
pH, UA: 6 (ref 5.0–7.5)

## 2023-10-16 LAB — MICROSCOPIC EXAMINATION

## 2023-10-16 LAB — BLADDER SCAN AMB NON-IMAGING

## 2023-10-16 MED ORDER — TAMSULOSIN HCL 0.4 MG PO CAPS: 0.4000 mg | ORAL_CAPSULE | Freq: Every day | ORAL | 3 refills | Status: AC

## 2023-10-16 MED ORDER — FESOTERODINE FUMARATE ER 8 MG PO TB24: 8.0000 mg | ORAL_TABLET | Freq: Every day | ORAL | 3 refills | Status: DC

## 2023-10-16 MED ORDER — FINASTERIDE 5 MG PO TABS: 5.0000 mg | ORAL_TABLET | Freq: Every day | ORAL | 3 refills | Status: AC

## 2023-10-16 NOTE — Progress Notes (Signed)
 I,Amy L Pierron,acting as a scribe for Dustin Gimenez, MD.,have documented all relevant documentation on the behalf of Dustin Gimenez, MD,as directed by  Dustin Gimenez, MD while in the presence of Dustin Gimenez, MD.  10/16/2023 8:20 PM   Catherne Clubs 1946/03/10 [id number]  Referring provider: Monique Ano, MD [phone] [facility] [address] [facility] [place],  [place] [address]  Chief Complaint  Patient presents with   Benign Prostatic Hypertrophy    HPI: 78 year-old male with a personal history of BPH, prostate cancer, and urge incontinence presents today for a routine annual follow-up.   His first prostate biopsy was done in June 2010 by Dr. Reola Casino which was positive for ASAP.  He underwent repeat prostate biopsy in 2012, which was a 16 core biopsy. It showed ASAP as well as Gleason 3+3 prostate cancer in a single core at the right lateral mid.  Repeat prostate biopsy later that year showed similar findings.  Present biopsy 2014 was negative.  He had a prostate MRI in 2016 (vol 47,9 cc) which showed no evidence of disease.    His BPH is managed with Finasteride  and Flomax .   He was previously on Toviaz  but felt it was too expensive and wanted to try the generic form.   He also has a personal history of ED and has tried and failed PD-5 inhibitors.   His most recent PSA from 10/11/2023 has been stable at 1.1 for many years.   He reports some urgency but otherwise good urinary control, managed with Toviaz , which was recently switched to a generic.   He expresses concerns about the necessity of continuing rectal exams and colonoscopies at his age, given his stable PSA levels.   He is currently taking Flomax , Finasteride , and Toviaz  for his condition.   He has reduced coffee intake, which has improved urinary frequency.    IPSS     Row Name 10/16/23 0900         International Prostate Symptom Score   How often have you had the sensation of not emptying your bladder?  Less than 1 in 5     How often have you had to urinate less than every two hours? Less than half the time     How often have you found you stopped and started again several times when you urinated? About half the time     How often have you found it difficult to postpone urination? More than half the time     How often have you had a weak urinary stream? Not at All     How often have you had to strain to start urination? Not at All     How many times did you typically get up at night to urinate? 1 Time     Total IPSS Score 11       Quality of Life due to urinary symptoms   If you were to spend the rest of your life with your urinary condition just the way it is now how would you feel about that? Mixed            Score:  1-7 Mild 8-19 Moderate 20-35 Severe Results for orders placed or performed in visit on 10/16/23  Microscopic Examination   Urine  Result Value Ref Range   WBC, UA 0-5 0 - 5 /hpf   RBC, Urine 0-2 0 - 2 /hpf   Epithelial Cells (non renal) 0-10 0 - 10 /hpf   Casts Present (A)  None seen /lpf   Cast Type Granular casts (A) N/A   Mucus, UA Present (A) Not Estab.   Bacteria, UA Few None seen/Few  Urinalysis, Complete  Result Value Ref Range   Specific Gravity, UA 1.030 1.005 - 1.030   pH, UA 6.0 5.0 - 7.5   Color, UA Yellow Yellow   Appearance Ur Clear Clear   Leukocytes,UA Negative Negative   Protein,UA Negative Negative/Trace   Glucose, UA Negative Negative   Ketones, UA Negative Negative   RBC, UA Negative Negative   Bilirubin, UA Negative Negative   Urobilinogen, Ur 0.2 0.2 - 1.0 mg/dL   Nitrite, UA Negative Negative   Microscopic Examination Comment    Microscopic Examination See below:   Bladder Scan (Post Void Residual) in office  Result Value Ref Range   Scan Result 12ml     PMH: Past Medical History:  Diagnosis Date   BPH (benign prostatic hyperplasia)    Diverticulosis    Dysplastic nevus 06/09/2021   right post deltoid- Severe, excised  07/19/21   GERD (gastroesophageal reflux disease)    GIB (gastrointestinal bleeding)    History of blood transfusion    History of SCC (squamous cell carcinoma) of skin 12/26/2017   left anterior shoulder   Hx of squamous cell carcinoma of skin 06/20/2017   R mid lateral forearm,   Macular hole    OAB (overactive bladder)    Sleep apnea    [facility]   Spontaneous pneumothorax    Squamous cell carcinoma of skin 06/30/2017   right mid lat forearm   Squamous cell carcinoma of skin 04/16/2019   left post shoulder   Squamous cell carcinoma of skin 07/26/2021   right buccal cheek, [facility]   Tobacco abuse     Surgical History: Past Surgical History:  Procedure Laterality Date   25 GAUGE PARS PLANA VITRECTOMY WITH 20 GAUGE MVR PORT FOR MACULAR HOLE Left 04/21/2014   Procedure: 25 GAUGE PARS PLANA VITRECTOMY WITH 20 GAUGE MVR PORT FOR MACULAR HOLE;  Surgeon: Rexene Catching, MD;  Location: [facility] OR;  Service: Ophthalmology;  Laterality: Left;   collapsed lung     back in 1968   COLONOSCOPY     CYST EXCISION     from his back x 2   GAS/FLUID EXCHANGE Left 04/21/2014   Procedure: GAS/FLUID EXCHANGE;  Surgeon: Rexene Catching, MD;  Location: [facility] OR;  Service: Ophthalmology;  Laterality: Left;   MEMBRANE PEEL Left 04/21/2014   Procedure: MEMBRANE PEEL;  Surgeon: Rexene Catching, MD;  Location: [facility] OR;  Service: Ophthalmology;  Laterality: Left;   SERUM PATCH Left 04/21/2014   Procedure: SERUM PATCH;  Surgeon: Rexene Catching, MD;  Location[facility] OR;  Service: Ophthalmology;  Laterality: Left;    Home Medications:  Allergies as of 10/16/2023   No Known Allergies      Medication List        Accurate as of October 16, 2023  8:20 PM. If you have any questions, ask your nurse or doctor.          famotidine 20 MG tablet Commonly known as: PEPCID Take 20 mg by mouth 2 (two) times daily.   fesoterodine  8 MG Tb24 tablet Commonly known as: TOVIAZ  Take 1 tablet (8 mg total) by mouth daily.    finasteride  5 MG tablet Commonly known as: PROSCAR  Take 1 tablet (5 mg total) by mouth daily.   hydrocortisone  2.5 % cream Apply to flaky, scaly areas of  the face QD-BID PRN.   ketoconazole  2 % cream Commonly known as: NIZORAL  Apply to scaly flaky areas of the face QD PRN.   lovastatin 20 MG tablet Commonly known as: MEVACOR   Multi-Vitamin tablet   multivitamin-iron-minerals-folic acid Tabs tablet Take 1 tablet by mouth daily.   pantoprazole  40 MG tablet Commonly known as: PROTONIX    pentoxifylline 400 MG CR tablet Commonly known as: TRENTAL Take 400 mg by mouth 2 (two) times daily.   tamsulosin  0.4 MG Caps capsule Commonly known as: FLOMAX  Take 1 capsule (0.4 mg total) by mouth daily.        Family History: Family History  Problem Relation Age of Onset   Leukemia Mother    Coronary artery disease Father    Peripheral vascular disease Brother     Social History:  reports that he has quit smoking. His smoking use included cigars. He has never used smokeless tobacco. He reports current alcohol use of about 14.0 standard drinks of alcohol per week. He reports that he does not use drugs.   Physical Exam: BP (!) 165/80   Pulse 96   Ht 6' (1.829 m)   Wt 182 lb (82.6 kg)   BMI 24.68 kg/m   Constitutional:  Alert and oriented, No acute distress. HEENT: [place] AT, moist mucus membranes.  Trachea midline, no masses. Neurologic: Grossly intact, no focal deficits, moving all 4 extremities. Psychiatric: Normal mood and affect.  Urinalysis    Component Value Date/Time   APPEARANCEUR Clear 10/16/2023 0948   GLUCOSEU Negative 10/16/2023 0948   BILIRUBINUR Negative 10/16/2023 0948   PROTEINUR Negative 10/16/2023 0948   NITRITE Negative 10/16/2023 0948   LEUKOCYTESUR Negative 10/16/2023 0948    Lab Results  Component Value Date   LABMICR Comment 10/16/2023   WBCUA 0-5 10/16/2023   LABEPIT 0-10 10/16/2023   MUCUS Present (A) 10/16/2023   BACTERIA Few 10/16/2023     Assessment & Plan:    1. Prostate Cancer - PSA remains stable at 1.1, which is low for his age, indicating effective management with Finasteride .   - Advised that annual rectal exams are recommended for prostate cancer monitoring, but given his stable PSA, the decision to forgo rectal exams is supported if he prefers.  2. Benign Prostatic Hyperplasia (BPH) with LUTS - Symptoms are well controlled with Finasteride  and Flomax . Recommend to continue these medications to manage prostate size and urinary symptoms.  3. Urge Incontinence - Currently managed with Toviaz , which was switched to a generic version due to cost. Discusses potential alternatives to Toviaz , such as Sanctura, Myrbetriq, or Gemtesa, which may have fewer cognitive side effects in patients aged 58 and older. However, he opts to continue with the current regimen.  Return in about 1 year (around 10/15/2024) for PSA.  [facility] [address], [address] [place], [place] [address] [phone]

## 2023-10-22 DIAGNOSIS — E782 Mixed hyperlipidemia: Secondary | ICD-10-CM | POA: Diagnosis not present

## 2023-10-26 DIAGNOSIS — R918 Other nonspecific abnormal finding of lung field: Secondary | ICD-10-CM | POA: Diagnosis not present

## 2023-10-26 DIAGNOSIS — C410 Malignant neoplasm of bones of skull and face: Secondary | ICD-10-CM | POA: Diagnosis not present

## 2023-10-29 DIAGNOSIS — E785 Hyperlipidemia, unspecified: Secondary | ICD-10-CM | POA: Diagnosis not present

## 2023-10-29 DIAGNOSIS — Z0000 Encounter for general adult medical examination without abnormal findings: Secondary | ICD-10-CM | POA: Diagnosis not present

## 2023-10-29 DIAGNOSIS — Z1331 Encounter for screening for depression: Secondary | ICD-10-CM | POA: Diagnosis not present

## 2023-10-30 ENCOUNTER — Other Ambulatory Visit: Payer: Self-pay | Admitting: Otolaryngology

## 2023-10-30 DIAGNOSIS — R918 Other nonspecific abnormal finding of lung field: Secondary | ICD-10-CM

## 2023-11-12 ENCOUNTER — Ambulatory Visit
Admission: RE | Admit: 2023-11-12 | Discharge: 2023-11-12 | Disposition: A | Discharge status: Home / Self Care | Source: Ambulatory Visit | Attending: Otolaryngology | Admitting: Otolaryngology

## 2023-11-12 DIAGNOSIS — J439 Emphysema, unspecified: Secondary | ICD-10-CM | POA: Diagnosis not present

## 2023-11-12 DIAGNOSIS — R918 Other nonspecific abnormal finding of lung field: Secondary | ICD-10-CM | POA: Diagnosis not present

## 2023-11-12 MED ORDER — IOHEXOL 300 MG/ML  SOLN
75.0000 mL | Freq: Once | INTRAMUSCULAR | Status: AC | PRN
  Administered 2023-11-12: 75 mL via INTRAVENOUS

## 2023-11-14 DIAGNOSIS — R918 Other nonspecific abnormal finding of lung field: Secondary | ICD-10-CM | POA: Diagnosis not present

## 2023-12-04 ENCOUNTER — Other Ambulatory Visit: Payer: Self-pay | Admitting: Urology

## 2023-12-04 DIAGNOSIS — N401 Enlarged prostate with lower urinary tract symptoms: Secondary | ICD-10-CM

## 2023-12-04 DIAGNOSIS — C61 Malignant neoplasm of prostate: Secondary | ICD-10-CM

## 2023-12-12 ENCOUNTER — Other Ambulatory Visit: Payer: Self-pay | Admitting: Urology

## 2023-12-12 DIAGNOSIS — N401 Enlarged prostate with lower urinary tract symptoms: Secondary | ICD-10-CM

## 2023-12-12 DIAGNOSIS — C61 Malignant neoplasm of prostate: Secondary | ICD-10-CM

## 2024-01-03 ENCOUNTER — Other Ambulatory Visit: Payer: Self-pay

## 2024-01-03 DIAGNOSIS — N401 Enlarged prostate with lower urinary tract symptoms: Secondary | ICD-10-CM

## 2024-01-03 DIAGNOSIS — C61 Malignant neoplasm of prostate: Secondary | ICD-10-CM

## 2024-01-03 MED ORDER — FESOTERODINE FUMARATE ER 8 MG PO TB24: 8.0000 mg | ORAL_TABLET | Freq: Every day | ORAL | 3 refills | Status: AC

## 2024-02-22 DIAGNOSIS — C410 Malignant neoplasm of bones of skull and face: Secondary | ICD-10-CM | POA: Diagnosis not present

## 2024-02-22 DIAGNOSIS — Z923 Personal history of irradiation: Secondary | ICD-10-CM | POA: Diagnosis not present

## 2024-02-22 DIAGNOSIS — R918 Other nonspecific abnormal finding of lung field: Secondary | ICD-10-CM | POA: Diagnosis not present

## 2024-05-21 ENCOUNTER — Ambulatory Visit: Attending: Family Medicine | Admitting: Physical Therapy

## 2024-05-21 DIAGNOSIS — R2681 Unsteadiness on feet: Secondary | ICD-10-CM | POA: Diagnosis present

## 2024-05-21 DIAGNOSIS — R269 Unspecified abnormalities of gait and mobility: Secondary | ICD-10-CM | POA: Diagnosis present

## 2024-05-21 DIAGNOSIS — R2689 Other abnormalities of gait and mobility: Secondary | ICD-10-CM | POA: Insufficient documentation

## 2024-05-21 DIAGNOSIS — R262 Difficulty in walking, not elsewhere classified: Secondary | ICD-10-CM | POA: Insufficient documentation

## 2024-05-21 DIAGNOSIS — M6281 Muscle weakness (generalized): Secondary | ICD-10-CM | POA: Diagnosis present

## 2024-05-21 NOTE — Therapy (Signed)
 " OUTPATIENT PHYSICAL THERAPY NEURO EVALUATION   Patient Name: Patrick Shepherd MRN: [id number] DOB:1945/07/22, 79 y.o., male Today's Date: 05/21/2024   PCP: Alla Amis, MD  REFERRING PROVIDER: Alla Amis, MD   END OF SESSION:  PT End of Session - 05/21/24 1235     Visit Number 1    Number of Visits 16    Date for Recertification  07/16/24    Progress Note Due on Visit 10    PT Start Time 1102    PT Stop Time 1144    PT Time Calculation (min) 42 min    Equipment Utilized During Treatment Gait belt    Activity Tolerance Patient tolerated treatment well    Behavior During Therapy WFL for tasks assessed/performed          Past Medical History:  Diagnosis Date   BPH (benign prostatic hyperplasia)    Diverticulosis    Dysplastic nevus 06/09/2021   right post deltoid- Severe, excised 07/19/21   GERD (gastroesophageal reflux disease)    GIB (gastrointestinal bleeding)    History of blood transfusion    History of SCC (squamous cell carcinoma) of skin 12/26/2017   left anterior shoulder   Hx of squamous cell carcinoma of skin 06/20/2017   R mid lateral forearm,   Macular hole    OAB (overactive bladder)    Sleep apnea    tested at [facility]   Spontaneous pneumothorax    Squamous cell carcinoma of skin 06/30/2017   right mid lat forearm   Squamous cell carcinoma of skin 04/16/2019   left post shoulder   Squamous cell carcinoma of skin 07/26/2021   right buccal cheek, EDC   Tobacco abuse    Past Surgical History:  Procedure Laterality Date   25 GAUGE PARS PLANA VITRECTOMY WITH 20 GAUGE MVR PORT FOR MACULAR HOLE Left 04/21/2014   Procedure: 25 GAUGE PARS PLANA VITRECTOMY WITH 20 GAUGE MVR PORT FOR MACULAR HOLE;  Surgeon: Norleen JONETTA Ku, MD;  Location: [facility] OR;  Service: Ophthalmology;  Laterality: Left;   collapsed lung     back in 1968   COLONOSCOPY     CYST EXCISION     from his back x 2   GAS/FLUID EXCHANGE Left 04/21/2014   Procedure: GAS/FLUID  EXCHANGE;  Surgeon: Norleen JONETTA Ku, MD;  Location: [facility] OR;  Service: Ophthalmology;  Laterality: Left;   MEMBRANE PEEL Left 04/21/2014   Procedure: MEMBRANE PEEL;  Surgeon: Norleen JONETTA Ku, MD;  Location: [facility] OR;  Service: Ophthalmology;  Laterality: Left;   SERUM PATCH Left 04/21/2014   Procedure: SERUM PATCH;  Surgeon: Norleen JONETTA Ku, MD;  Location: 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, right 03/01/2022   Combined arterial insufficiency and corporo-venous occlusive erectile dysfunction 01/29/2017   Mixed hyperlipidemia 11/29/2016   Smokes cigars 11/27/2016   OAB (overactive bladder) 01/26/2016   Rectal bleeding 11/24/2015   Chest pain 11/24/2015   Perineal abscess 11/24/2015   GIB (gastrointestinal bleeding) 11/24/2015   BPH (benign prostatic hyperplasia)    GERD (gastroesophageal reflux disease)    Sleep apnea    Diverticulosis    Macular hole, left eye 04/21/2014   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, 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   Diverticulosis of large intestine without hemorrhage 09/25/2012   Malignant neoplasm  of prostate (HCC) 05/31/2012    ONSET DATE: 2 year ago  REFERRING DIAG: R26.89 (ICD-10-CM) - Balance problem   THERAPY DIAG:  Abnormality of gait and mobility  Difficulty in walking, not elsewhere classified  Muscle weakness (generalized)  Other abnormalities of gait and mobility  Unsteadiness on feet  Rationale for Evaluation and Treatment: Rehabilitation  SUBJECTIVE:                                                                                                                                                                                             SUBJECTIVE STATEMENT:  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 Pt has had progressively worsening balance since the, likely due to his inner ear being removed. Pt has had one fall in the past 6 months due to his dog leash but he also has a lot of stumbles. 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 Pt has difficulty bending down to pick up droppings recently. Pt has difficulty with walking straight down the hallway. Pt has vertigo once every several years but is not the usual source of hi problems.   Pt accompanied by: self  PERTINENT HISTORY: PMX of : GERD, R ear canal procedure to remove cancerous mass, BPH,    PAIN:  Are you having pain? No  PRECAUTIONS: Fall  RED FLAGS: None   WEIGHT BEARING RESTRICTIONS: No  FALLS: Has patient fallen in last 6 months? Yes. Number of falls 1  LIVING ENVIRONMENT: Lives with: lives with their family and lives with their spouse Lives in: House/apartment Stairs: Yes: External: 5 steps; can reach both Has following equipment at home: Single point cane and Walker - 4 wheeled  PLOF: Independent, Independent with basic ADLs, and Independent with household mobility with device  PATIENT GOALS: improve balance, be able to walk dog more confidently   OBJECTIVE:  Note: Objective measures were completed at Evaluation unless otherwise noted.  DIAGNOSTIC FINDINGS: n/a  COGNITION: Overall cognitive status: Within functional limits for tasks assessed       LOWER EXTREMITY MMT:    MMT Right Eval Left Eval  Hip flexion 4 4  Hip extension    Hip abduction    Hip adduction    Hip internal rotation    Hip external rotation    Knee flexion    Knee extension    Ankle dorsiflexion    Ankle plantarflexion    Ankle inversion    Ankle eversion    (Blank rows =  not tested)  BED MOBILITY:  Not tested  TRANSFERS: Sit to stand: Complete Independence  Assistive device utilized: None     Stand to sit: Complete Independence  Assistive device utilized: None      Chair to chair: Complete Independence  Assistive device utilized: None       RAMP:  Not tested  CURB:  Not tested  STAIRS: Findings: Number of Stairs: 4, Height of Stairs: 6in   , and Comments: B UE  GAIT: Findings: Distance walked: 30 ft and Comments: slow speed, short step length   FUNCTIONAL TESTS:  5 times sit to stand: 24.47 sec 10 meter walk test: .13m/s Dynamic Gait Index: Dynamic Gait Index  Mark the lowest level that applies.   [facility] PT Assessment - 05/21/24 0001       Standardized Balance Assessment   Standardized Balance Assessment Dynamic Gait Index      Dynamic Gait Index   Level Surface Mild Impairment    Change in Gait Speed Mild Impairment    Gait with Horizontal Head Turns Moderate Impairment    Gait with Vertical Head Turns Moderate Impairment    Gait and Pivot Turn Mild Impairment    Step Over Obstacle Severe Impairment    Step Around Obstacles Mild Impairment    Steps Mild Impairment    Total Score 12            Score Interpretation: Score of <19 indicates high risk of falls.  Minimally Clinically Important Difference (MCID):  =DGI scores of<21/24 = 1.80 points DGI scores of >21/24 = 0.60 points   Jefferson T, Inbar-Borovsky N, Brozgol M, Giladi N, [place] JM. The Dynamic Gait Index in healthy older adults: the role of stair climbing, fear of falling and gender. Gait Posture. 2009 Feb;29(2):237-41. doi: 10.1016/j.gaitpost.2008.08.013. Epub 2008 Oct 8. PMID: 81154560; PMCID: EFR7290501.  Pardasaney, MYRTIS LOIS Bonus, GEANNIE POUR., et al. (2012). Sensitivity to change and responsiveness of four balance measures for community-dwelling older adults. Physical therapy 92(3): 388-397.   PATIENT SURVEYS:  ABC scale: The Activities-Specific Balance Confidence (ABC) Scale 0% 10 20 30  40 50 60 70 80 90 100% No confidence<->completely confident  How confident are you that you will not lose your balance or become unsteady when you . . .   ABC scale: The  Activities-Specific Balance Confidence (ABC) Scale   No confidence<->completely confident     How confident are you that you will not lose your balance or become unsteady when you . . .       Date tested    1: Walk around the house 90   2. Walk up or down stairs 70   3. Bend over and pick up a slipper from in front of a closet floor 70   4. Reach for a small can off a shelf at eye level 100   5. Stand on tip toes and reach for something above your head 90   6. Stand on a chair and reach for something 20   7. Sweep the floor 90   8. Walk outside the house to a car parked in the driveway 100   9. Get into or out of a car 100   10. Walk across a parking lot to the mall 80   11. Walk up or down a ramp 50   12. Walk in a crowded mall where people rapidly walk past you 80   13. Are bumped into by people as you walk through the mall 60  14. Step onto or off of an escalator while you are holding onto the railing 60   15. Step onto or off an escalator while holding onto parcels such that you cannot hold onto the railing 40   16. Walk outside on icy sidewalks 0   Total: #/16 1100 0.6875    68.75 %                                                                                                                                 TREATMENT DATE: 05/21/2024   SELF CARE Patient instructed in plan of care, findings for evaluation, and ways of physical therapy may improve their function and quality of life.     PATIENT EDUCATION: Education details: POC Person educated: Patient Education method: Explanation Education comprehension: verbalized understanding   HOME EXERCISE PROGRAM: Establish visit 2    GOALS: Goals reviewed with patient? Yes  SHORT TERM GOALS: Target date: 06/18/2024   Patient will be independent in home exercise program to improve strength/mobility for better functional independence with ADLs. Baseline: No HEP currently  Goal status: INITIAL  LONG TERM GOALS: Target date:  07/16/2024   1.  Patient will complete five times sit to stand test in < 15 seconds indicating an increased LE strength and improved balance. Baseline: 24.47 sec no UE Goal status: INITIAL  2.  Patient will improve ABC score to 78%   to demonstrate statistically significant improvement in mobility and quality of life as it relates to their Balance and mobility Goal status: INITIAL   3.  Patient will increase DGI Balance score by > 6 points to demonstrate decreased fall risk during functional activities. Baseline: 12 Goal status: INITIAL    4. Patient will increase 10 meter walk test to >1.36m/s as to improve gait speed for better community ambulation and to reduce fall risk. Baseline: .78 m/s Goal status: INITIAL  5.   Patient will demonstrate ability to bend down to floor level and pick up objects x 10 repetitions on unstable or unlevel surface to indicate improved ability to care for dog. Baseline: Not confident doing this at eval per report Goal status: INITIAL    ASSESSMENT:  CLINICAL IMPRESSION:  Patient is a 79 year old male who presents to physical therapy for issues with his balance.  Patient has had a history of right ear canal surgery that has limited his vestibular system on the side and likely caused relevant impairments with his balance.  Patient demonstrates increased risk of falls based on his functional testing this date.  Based on functional testing as well as patient's history patient is likely to demonstrate improvement with physical therapy interventions.  Patient will benefit from skilled physical therapy to address his impairments, decrease his risk of falls, and improve his overall mobility and quality of life.  OBJECTIVE IMPAIRMENTS: Abnormal gait, decreased activity tolerance, decreased balance, and decreased strength.   ACTIVITY LIMITATIONS: squatting, stairs, and locomotion level  PARTICIPATION LIMITATIONS: community activity and yard work  PERSONAL  FACTORS: Age, Time since onset of injury/illness/exacerbation, and 1 comorbidity: R ear canal removal due to cancerous mass  are also affecting patient's functional outcome.   REHAB POTENTIAL: Good  CLINICAL DECISION MAKING: Stable/uncomplicated  EVALUATION COMPLEXITY: Low  PLAN:  PT FREQUENCY: 2x/week  PT DURATION: 8 weeks  PLANNED INTERVENTIONS: 97750- Physical Performance Testing, 97110-Therapeutic exercises, 97530- Therapeutic activity, [id number]- Neuromuscular re-education, 97535- Self Care, [id number]- Manual therapy, [phone]- Gait training, [phone]- Canalith repositioning, Patient/Family education, Balance training, Stair training, and Vestibular training  PLAN FOR NEXT SESSION: Begin balance training - provide HEP- some functional strength as needed    Lonni KATHEE Gainer, PT 05/21/2024, 12:36 PM        "

## 2024-05-23 ENCOUNTER — Ambulatory Visit: Admitting: Physical Therapy

## 2024-05-23 DIAGNOSIS — R2689 Other abnormalities of gait and mobility: Secondary | ICD-10-CM

## 2024-05-23 DIAGNOSIS — R2681 Unsteadiness on feet: Secondary | ICD-10-CM

## 2024-05-23 DIAGNOSIS — R269 Unspecified abnormalities of gait and mobility: Secondary | ICD-10-CM | POA: Diagnosis not present

## 2024-05-23 DIAGNOSIS — R262 Difficulty in walking, not elsewhere classified: Secondary | ICD-10-CM

## 2024-05-23 DIAGNOSIS — M6281 Muscle weakness (generalized): Secondary | ICD-10-CM

## 2024-05-23 NOTE — Therapy (Signed)
 " OUTPATIENT PHYSICAL THERAPY NEURO TREATMENT   Patient Name: Patrick Shepherd MRN: [id number] DOB:[birth year], 79 y.o., male Today's Date: 05/23/2024   PCP: Alla Amis, MD  REFERRING PROVIDER: Alla Amis, MD   END OF SESSION:  PT End of Session - 05/23/24 1024     Visit Number 2    Number of Visits 16    Date for Recertification  07/16/24    Progress Note Due on Visit 10    PT Start Time 0931    PT Stop Time 1018    PT Time Calculation (min) 47 min    Equipment Utilized During Treatment Gait belt    Activity Tolerance Patient tolerated treatment well    Behavior During Therapy WFL for tasks assessed/performed           Past Medical History:  Diagnosis Date   BPH (benign prostatic hyperplasia)    Diverticulosis    Dysplastic nevus 06/09/2021   right post deltoid- Severe, excised 07/19/21   GERD (gastroesophageal reflux disease)    GIB (gastrointestinal bleeding)    History of blood transfusion    History of SCC (squamous cell carcinoma) of skin 12/26/2017   left anterior shoulder   Hx of squamous cell carcinoma of skin 06/20/2017   R mid lateral forearm,   Macular hole    OAB (overactive bladder)    Sleep apnea    tested at [facility]   Spontaneous pneumothorax    Squamous cell carcinoma of skin 06/30/2017   right mid lat forearm   Squamous cell carcinoma of skin 04/16/2019   left post shoulder   Squamous cell carcinoma of skin 07/26/2021   right buccal cheek, EDC   Tobacco abuse    Past Surgical History:  Procedure Laterality Date   25 GAUGE PARS PLANA VITRECTOMY WITH 20 GAUGE MVR PORT FOR MACULAR HOLE Left 04/21/2014   Procedure: 25 GAUGE PARS PLANA VITRECTOMY WITH 20 GAUGE MVR PORT FOR MACULAR HOLE;  Surgeon: Norleen JONETTA Ku, MD;  Location: [facility] OR;  Service: Ophthalmology;  Laterality: Left;   collapsed lung     back in 1968   COLONOSCOPY     CYST EXCISION     from his back x 2   GAS/FLUID EXCHANGE Left 04/21/2014   Procedure: GAS/FLUID  EXCHANGE;  Surgeon: Norleen JONETTA Ku, MD;  Location: [facility] OR;  Service: Ophthalmology;  Laterality: Left;   MEMBRANE PEEL Left 04/21/2014   Procedure: MEMBRANE PEEL;  Surgeon: Norleen JONETTA Ku, MD;  [facility] OR;  Service: Ophthalmology;  Laterality: Left;   SERUM PATCH Left 04/21/2014   Procedure: SERUM PATCH;  Surgeon: Norleen JONETTA Ku, MD;  Location: [facility] OR;  Service: Ophthalmology;  Laterality: Left;   Patient Active Problem List   Diagnosis Date Noted   Hx of squamous cell carcinoma of skin 07/17/2022   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, right 03/01/2022   Combined arterial insufficiency and corporo-venous occlusive erectile dysfunction 01/29/2017   Mixed hyperlipidemia 11/29/2016   Smokes cigars 11/27/2016   OAB (overactive bladder) 01/26/2016   Rectal bleeding 11/24/2015   Chest pain 11/24/2015   Perineal abscess 11/24/2015   GIB (gastrointestinal bleeding) 11/24/2015   BPH (benign prostatic hyperplasia)    GERD (gastroesophageal reflux disease)    Sleep apnea    Diverticulosis    Macular hole, left eye 04/21/2014   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, 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   Diverticulosis of large intestine without hemorrhage 09/25/2012   Malignant  neoplasm of prostate (HCC) 05/31/2012    ONSET DATE: 2 year ago  REFERRING DIAG: R26.89 (ICD-10-CM) - Balance problem   THERAPY DIAG:  Abnormality of gait and mobility  Difficulty in walking, not elsewhere classified  Muscle weakness (generalized)  Other abnormalities of gait and mobility  Unsteadiness on feet  Rationale for Evaluation and Treatment: Rehabilitation  SUBJECTIVE:                                                                                                                                                                                             SUBJECTIVE STATEMENT:   Pt accompanied by: self  PERTINENT HISTORY: PMX of : GERD, R ear canal procedure to remove  cancerous mass, BPH  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 Pt has had progressively worsening balance since the, likely due to his inner ear being removed. Pt has had one fall in the past 6 months due to his dog leash but he also has a lot of stumbles. 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 Pt has difficulty bending down to pick up droppings recently. Pt has difficulty with walking straight down the hallway. Pt has vertigo once every several years but is not the usual source of hi problems.   PAIN:  Are you having pain? No  PRECAUTIONS: Fall  RED FLAGS: None   WEIGHT BEARING RESTRICTIONS: No  FALLS: Has patient fallen in last 6 months? Yes. Number of falls 1  LIVING ENVIRONMENT: Lives with: lives with their family and lives with their spouse Lives in: House/apartment Stairs: Yes: External: 5 steps; can reach both Has following equipment at home: Single point cane and Walker - 4 wheeled  PLOF: Independent, Independent with basic ADLs, and Independent with household mobility with device  PATIENT GOALS: improve balance, be able to walk dog more confidently   OBJECTIVE:  Note: Objective measures were completed at Evaluation unless otherwise noted.  DIAGNOSTIC FINDINGS: n/a  COGNITION: Overall cognitive status: Within functional limits for tasks assessed       LOWER EXTREMITY MMT:    MMT Right Eval Left Eval  Hip flexion 4 4  Hip extension    Hip abduction    Hip adduction    Hip internal rotation    Hip external rotation    Knee flexion    Knee extension    Ankle dorsiflexion    Ankle plantarflexion    Ankle inversion    Ankle eversion    (Blank rows =  not tested)  BED MOBILITY:  Not tested  TRANSFERS: Sit to stand: Complete Independence  Assistive device utilized: None     Stand to sit: Complete Independence  Assistive device utilized: None      Chair to chair: Complete Independence  Assistive device utilized: None       RAMP:  Not tested  CURB:  Not tested  STAIRS: Findings: Number of Stairs: 4, Height of Stairs: 6in   , and Comments: B UE  GAIT: Findings: Distance walked: 30 ft and Comments: slow speed, short step length   FUNCTIONAL TESTS:  5 times sit to stand: 24.47 sec 10 meter walk test: .15m/s Dynamic Gait Index: Dynamic Gait Index  Mark the lowest level that applies.       Score Interpretation: Score of <19 indicates high risk of falls.  Minimally Clinically Important Difference (MCID):  =DGI scores of<21/24 = 1.80 points DGI scores of >21/24 = 0.60 points   [place] T, Inbar-Borovsky N, Brozgol M, Giladi N, [place] JM. The Dynamic Gait Index in healthy older adults: the role of stair climbing, fear of falling and gender. Gait Posture. 2009 Feb;29(2):237-41. doi: 10.1016/j.gaitpost.2008.08.013. Epub 2008 Oct 8. PMID: 81154560; PMCID: EFR7290501.  Pardasaney, MYRTIS LOIS Bonus, GEANNIE POUR., et al. (2012). Sensitivity to change and responsiveness of four balance measures for community-dwelling older adults. Physical therapy 92(3): 388-397.   PATIENT SURVEYS:  ABC scale: The Activities-Specific Balance Confidence (ABC) Scale 0% 10 20 30  40 50 60 70 80 90 100% No confidence<->completely confident  How confident are you that you will not lose your balance or become unsteady when you . . .   ABC scale: The Activities-Specific Balance Confidence (ABC) Scale   No confidence<->completely confident     How confident are you that you will not lose your balance or become unsteady when you . . .       Date tested    1: Walk around the house 90   2. Walk up or down stairs 70   3. Bend over and pick up a slipper from in front of a closet floor 70   4. Reach for a small can off a shelf at eye level 100   5. Stand on tip toes and reach for something above your head 90   6. Stand on a chair and reach for something 20    7. Sweep the floor 90   8. Walk outside the house to a car parked in the driveway 100   9. Get into or out of a car 100   10. Walk across a parking lot to the mall 80   11. Walk up or down a ramp 50   12. Walk in a crowded mall where people rapidly walk past you 80   13. Are bumped into by people as you walk through the mall 60   14. Step onto or off of an escalator while you are holding onto the railing 60   15. Step onto or off an escalator while holding onto parcels such that you cannot hold onto the railing 40   16. Walk outside on icy sidewalks 0   Total: #/16 1100 0.6875    68.75 %  TREATMENT DATE: 05/23/2024   NMR: To facilitate reeducation of movement, balance, posture, coordination, and/or proprioception/kinesthetic sense.  Exercises - Standing eyes closed at supportive surface   - 1 x daily - 5 x weekly - 3 sets - 30 sec hold - Marching Near Counter  - 1 x daily - 5 x weekly - 2 sets - 10 reps - 5 sec  hold - Heel Toe Raises  near counter  - 1 x daily - 7 x weekly - 2 sets - 10 reps - Wide tandem with counter support as needed   - 1 x daily - 7 x weekly - 3 sets - 30 sec hold  Sidestepping on and off airex x 10 ea   Forward and retro step on and off airex x 5 ea     TA- To improve functional movements patterns for everyday tasks   Squat to pick up ball from bin then basketball shot x 14  Added airex under feet for compliant surface training and elevated ball bin x 14 rounds  - both mimicking picking up dog waste   PATIENT EDUCATION: Education details: POC Person educated: Patient Education method: Explanation Education comprehension: verbalized understanding   HOME EXERCISE PROGRAM: Establish visit 2    GOALS: Goals reviewed with patient? Yes  SHORT TERM GOALS: Target date: 06/18/2024   Patient will be independent in home exercise  program to improve strength/mobility for better functional independence with ADLs. Baseline: No HEP currently  Goal status: INITIAL  LONG TERM GOALS: Target date: 07/16/2024   1.  Patient will complete five times sit to stand test in < 15 seconds indicating an increased LE strength and improved balance. Baseline: 24.47 sec no UE Goal status: INITIAL  2.  Patient will improve ABC score to 78%   to demonstrate statistically significant improvement in mobility and quality of life as it relates to their Balance and mobility Goal status: INITIAL   3.  Patient will increase DGI Balance score by > 6 points to demonstrate decreased fall risk during functional activities. Baseline: 12 Goal status: INITIAL    4. Patient will increase 10 meter walk test to >1.56m/s as to improve gait speed for better community ambulation and to reduce fall risk. Baseline: .78 m/s Goal status: INITIAL  5.   Patient will demonstrate ability to bend down to floor level and pick up objects x 10 repetitions on unstable or unlevel surface to indicate improved ability to care for dog. Baseline: Not confident doing this at eval per report Goal status: INITIAL    ASSESSMENT:  CLINICAL IMPRESSION:  Patient is a 79 year old male who presents to physical therapy for issues with his balance.  Patient has had a history of right ear canal surgery that has limited his vestibular system on the side and likely caused relevant impairments with his balance.  Patient demonstrates increased risk of falls based on his functional testing this date.  Based on functional testing as well as patient's history patient is likely to demonstrate improvement with physical therapy interventions.  Patient will benefit from skilled physical therapy to address his impairments, decrease his risk of falls, and improve his overall mobility and quality of life.  OBJECTIVE IMPAIRMENTS: Abnormal gait, decreased activity tolerance, decreased balance, and  decreased strength.   ACTIVITY LIMITATIONS: squatting, stairs, and locomotion level  PARTICIPATION LIMITATIONS: community activity and yard work  PERSONAL FACTORS: Age, Time since onset of injury/illness/exacerbation, and 1 comorbidity: R ear canal removal due to cancerous mass  are  also affecting patient's functional outcome.   REHAB POTENTIAL: Good  CLINICAL DECISION MAKING: Stable/uncomplicated  EVALUATION COMPLEXITY: Low  PLAN:  PT FREQUENCY: 2x/week  PT DURATION: 8 weeks  PLANNED INTERVENTIONS: 97750- Physical Performance Testing, 97110-Therapeutic exercises, 97530- Therapeutic activity, 97112- Neuromuscular re-education, 97535- Self Care, [id number]- Manual therapy, [phone]- Gait training, [phone]- Canalith repositioning, Patient/Family education, Balance training, Stair training, and Vestibular training  PLAN FOR NEXT SESSION: Begin balance training - provide HEP- some functional strength as needed  -activities related to dog and integration of compliant surfaces   -pulling cable machine, compliant surface, functional strength   -increasing challenge of balance systems to accound for loss of vestibular input   Lonni KATHEE Gainer, PT 05/23/2024, 10:25 AM        "

## 2024-05-26 ENCOUNTER — Ambulatory Visit: Admitting: Physical Therapy

## 2024-05-26 DIAGNOSIS — R2689 Other abnormalities of gait and mobility: Secondary | ICD-10-CM

## 2024-05-26 DIAGNOSIS — R262 Difficulty in walking, not elsewhere classified: Secondary | ICD-10-CM

## 2024-05-26 DIAGNOSIS — R2681 Unsteadiness on feet: Secondary | ICD-10-CM

## 2024-05-26 DIAGNOSIS — M6281 Muscle weakness (generalized): Secondary | ICD-10-CM

## 2024-05-26 DIAGNOSIS — R269 Unspecified abnormalities of gait and mobility: Secondary | ICD-10-CM | POA: Diagnosis not present

## 2024-05-26 NOTE — Therapy (Signed)
 " OUTPATIENT PHYSICAL THERAPY NEURO TREATMENT   Patient Name: Patrick Shepherd MRN: [id number] DOB:11-18-1945, 79 y.o., male Today's Date: 05/26/2024   PCP: Alla Amis, MD  REFERRING PROVIDER: Alla Amis, MD   END OF SESSION:  PT End of Session - 05/26/24 1320     Visit Number 3    Number of Visits 16    Date for Recertification  07/16/24    Progress Note Due on Visit 10    PT Start Time 1317    PT Stop Time 1358    PT Time Calculation (min) 41 min    Equipment Utilized During Treatment Gait belt    Activity Tolerance Patient tolerated treatment well    Behavior During Therapy WFL for tasks assessed/performed            Past Medical History:  Diagnosis Date   BPH (benign prostatic hyperplasia)    Diverticulosis    Dysplastic nevus 06/09/2021   right post deltoid- Severe, excised 07/19/21   GERD (gastroesophageal reflux disease)    GIB (gastrointestinal bleeding)    History of blood transfusion    History of SCC (squamous cell carcinoma) of skin 12/26/2017   left anterior shoulder   Hx of squamous cell carcinoma of skin 06/20/2017   R mid lateral forearm,   Macular hole    OAB (overactive bladder)    Sleep apnea    tested at [facility]   Spontaneous pneumothorax    Squamous cell carcinoma of skin 06/30/2017   right mid lat forearm   Squamous cell carcinoma of skin 04/16/2019   left post shoulder   Squamous cell carcinoma of skin 07/26/2021   right buccal cheek, EDC   Tobacco abuse    Past Surgical History:  Procedure Laterality Date   25 GAUGE PARS PLANA VITRECTOMY WITH 20 GAUGE MVR PORT FOR MACULAR HOLE Left 04/21/2014   Procedure: 25 GAUGE PARS PLANA VITRECTOMY WITH 20 GAUGE MVR PORT FOR MACULAR HOLE;  Surgeon: Norleen JONETTA Ku, MD;  Location: [facility] OR;  Service: Ophthalmology;  Laterality: Left;   collapsed lung     back in 1968   COLONOSCOPY     CYST EXCISION     from his back x 2   GAS/FLUID EXCHANGE Left 04/21/2014   Procedure: GAS/FLUID  EXCHANGE;  Surgeon: Norleen JONETTA Ku, MD;  Location: [facility] OR;  Service: Ophthalmology;  Laterality: Left;   MEMBRANE PEEL Left 04/21/2014   Procedure: MEMBRANE PEEL;  Surgeon: Norleen JONETTA Ku, MD;  Location: [facility] OR;  Service: Ophthalmology;  Laterality: Left;   SERUM PATCH Left 04/21/2014   Procedure: SERUM PATCH;  Surgeon: Norleen JONETTA Ku, MD;  Location: [facility] OR;  Service: Ophthalmology;  Laterality: Left;   Patient Active Problem List   Diagnosis Date Noted   Hx of squamous cell carcinoma of skin 07/17/2022   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, right 03/01/2022   Combined arterial insufficiency and corporo-venous occlusive erectile dysfunction 01/29/2017   Mixed hyperlipidemia 11/29/2016   Smokes cigars 11/27/2016   OAB (overactive bladder) 01/26/2016   Rectal bleeding 11/24/2015   Chest pain 11/24/2015   Perineal abscess 11/24/2015   GIB (gastrointestinal bleeding) 11/24/2015   BPH (benign prostatic hyperplasia)    GERD (gastroesophageal reflux disease)    Sleep apnea    Diverticulosis    Macular hole, left eye 04/21/2014   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, 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   Diverticulosis of large intestine without hemorrhage 09/25/2012  Malignant neoplasm of prostate (HCC) 05/31/2012    ONSET DATE: 2 year ago  REFERRING DIAG: R26.89 (ICD-10-CM) - Balance problem   THERAPY DIAG:  Abnormality of gait and mobility  Difficulty in walking, not elsewhere classified  Muscle weakness (generalized)  Other abnormalities of gait and mobility  Unsteadiness on feet  Rationale for Evaluation and Treatment: Rehabilitation  SUBJECTIVE:                                                                                                                                                                                             SUBJECTIVE STATEMENT:   Pt accompanied by: self  PERTINENT HISTORY: PMX of : GERD, R ear canal procedure to remove  cancerous mass, BPH  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 Pt has had progressively worsening balance since the, likely due to his inner ear being removed. Pt has had one fall in the past 6 months due to his dog leash but he also has a lot of stumbles. 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 Pt has difficulty bending down to pick up droppings recently. Pt has difficulty with walking straight down the hallway. Pt has vertigo once every several years but is not the usual source of hi problems.   PAIN:  Are you having pain? No  PRECAUTIONS: Fall  RED FLAGS: None   WEIGHT BEARING RESTRICTIONS: No  FALLS: Has patient fallen in last 6 months? Yes. Number of falls 1  LIVING ENVIRONMENT: Lives with: lives with their family and lives with their spouse Lives in: House/apartment Stairs: Yes: External: 5 steps; can reach both Has following equipment at home: Single point cane and Walker - 4 wheeled  PLOF: Independent, Independent with basic ADLs, and Independent with household mobility with device  PATIENT GOALS: improve balance, be able to walk dog more confidently   OBJECTIVE:  Note: Objective measures were completed at Evaluation unless otherwise noted.  DIAGNOSTIC FINDINGS: n/a  COGNITION: Overall cognitive status: Within functional limits for tasks assessed       LOWER EXTREMITY MMT:    MMT Right Eval Left Eval  Hip flexion 4 4  Hip extension    Hip abduction    Hip adduction    Hip internal rotation    Hip external rotation    Knee flexion    Knee extension    Ankle dorsiflexion    Ankle plantarflexion    Ankle inversion    Ankle eversion    (Blank  rows = not tested)  BED MOBILITY:  Not tested  TRANSFERS: Sit to stand: Complete Independence  Assistive device utilized: None     Stand to sit: Complete Independence  Assistive device utilized: None      Chair to chair: Complete Independence  Assistive device utilized: None       RAMP:  Not tested  CURB:  Not tested  STAIRS: Findings: Number of Stairs: 4, Height of Stairs: 6in   , and Comments: B UE  GAIT: Findings: Distance walked: 30 ft and Comments: slow speed, short step length   FUNCTIONAL TESTS:  5 times sit to stand: 24.47 sec 10 meter walk test: .95m/s Dynamic Gait Index: Dynamic Gait Index  Mark the lowest level that applies.       Score Interpretation: Score of <19 indicates high risk of falls.  Minimally Clinically Important Difference (MCID):  =DGI scores of<21/24 = 1.80 points DGI scores of >21/24 = 0.60 points   [place] T, Inbar-Borovsky N, Brozgol M, Giladi N, [place] JM. The Dynamic Gait Index in healthy older adults: the role of stair climbing, fear of falling and gender. Gait Posture. 2009 Feb;29(2):237-41. doi: 10.1016/j.gaitpost.2008.08.013. Epub 2008 Oct 8. PMID: 81154560; PMCID: EFR7290501.  Pardasaney, MYRTIS LOIS Bonus, GEANNIE POUR., et al. (2012). Sensitivity to change and responsiveness of four balance measures for community-dwelling older adults. Physical therapy 92(3): 388-397.   PATIENT SURVEYS:  ABC scale: The Activities-Specific Balance Confidence (ABC) Scale 0% 10 20 30  40 50 60 70 80 90 100% No confidence<->completely confident  How confident are you that you will not lose your balance or become unsteady when you . . .   ABC scale: The Activities-Specific Balance Confidence (ABC) Scale   No confidence<->completely confident     How confident are you that you will not lose your balance or become unsteady when you . . .       Date tested    1: Walk around the house 90   2. Walk up or down stairs 70   3. Bend over and pick up a slipper from in front of a closet floor 70   4. Reach for a small can off a shelf at eye level 100   5. Stand on tip toes and reach for something above your head 90   6. Stand on a chair and reach for something 20    7. Sweep the floor 90   8. Walk outside the house to a car parked in the driveway 100   9. Get into or out of a car 100   10. Walk across a parking lot to the mall 80   11. Walk up or down a ramp 50   12. Walk in a crowded mall where people rapidly walk past you 80   13. Are bumped into by people as you walk through the mall 60   14. Step onto or off of an escalator while you are holding onto the railing 60   15. Step onto or off an escalator while holding onto parcels such that you cannot hold onto the railing 40   16. Walk outside on icy sidewalks 0   Total: #/16 1100 0.6875    68.75 %  TREATMENT DATE: 05/26/2024      NMR: To facilitate reeducation of movement, balance, posture, coordination, and/or proprioception/kinesthetic sense.  EC balance on airex pad normal BOS  Sidestepping on and off airex x 10 ea   Forward and retro step on and off airex 2 x 5 ea  -difficulty with foot clearance on rep 1   Dog walking simulation - retro and forward walk with cable machine x 3 reps holding rope attachment in ea LE (6 total) with 7.5  - round 2 another 3 sets ea UE with 12.5#   Dog droppings pickup simulation pt picks up cones placed precariously around clinic 2 x 7 reps- no LOB, good procedural planning of getting in and out of spaces   4 obstacle walking on and over airex then hurdle then 6in step then airex 180 deg turn x 10 laps - step to pattern just completing target in self selected pattern  - same course but challenged with step over gait pattern. X 6 times through   PATIENT EDUCATION: Education details: POC Person educated: Patient Education method: Explanation Education comprehension: verbalized understanding   HOME EXERCISE PROGRAM: Establish visit 2    GOALS: Goals reviewed with patient? Yes  SHORT TERM GOALS: Target date:  06/18/2024   Patient will be independent in home exercise program to improve strength/mobility for better functional independence with ADLs. Baseline: No HEP currently  Goal status: INITIAL  LONG TERM GOALS: Target date: 07/16/2024   1.  Patient will complete five times sit to stand test in < 15 seconds indicating an increased LE strength and improved balance. Baseline: 24.47 sec no UE Goal status: INITIAL  2.  Patient will improve ABC score to 78%   to demonstrate statistically significant improvement in mobility and quality of life as it relates to their Balance and mobility Goal status: INITIAL   3.  Patient will increase DGI Balance score by > 6 points to demonstrate decreased fall risk during functional activities. Baseline: 12 Goal status: INITIAL    4. Patient will increase 10 meter walk test to >1.63m/s as to improve gait speed for better community ambulation and to reduce fall risk. Baseline: .78 m/s Goal status: INITIAL  5.   Patient will demonstrate ability to bend down to floor level and pick up objects x 10 repetitions on unstable or unlevel surface to indicate improved ability to care for dog. Baseline: Not confident doing this at eval per report Goal status: INITIAL    ASSESSMENT:  CLINICAL IMPRESSION:  Patient is a 79 year old male who presents to physical therapy for issues with his balance.  Patient has had a history of right ear canal surgery that has limited his vestibular system on the side and likely caused relevant impairments with his balance. Pt challenged with dynamic functional balance activities to improve his balance reactions and adaptability to his environment. Pt progressed well but still demonstrated balance issues limiting his functional status. Patient will benefit from skilled physical therapy to address his impairments, decrease his risk of falls, and improve his overall mobility and quality of life.  OBJECTIVE IMPAIRMENTS: Abnormal gait,  decreased activity tolerance, decreased balance, and decreased strength.   ACTIVITY LIMITATIONS: squatting, stairs, and locomotion level  PARTICIPATION LIMITATIONS: community activity and yard work  PERSONAL FACTORS: Age, Time since onset of injury/illness/exacerbation, and 1 comorbidity: R ear canal removal due to cancerous mass  are also affecting patient's functional outcome.   REHAB POTENTIAL: Good  CLINICAL DECISION MAKING: Stable/uncomplicated  EVALUATION COMPLEXITY: Low  PLAN:  PT FREQUENCY: 2x/week  PT DURATION: 8 weeks  PLANNED INTERVENTIONS: 97750- Physical Performance Testing, 97110-Therapeutic exercises, 97530- Therapeutic activity, 97112- Neuromuscular re-education, 97535- Self Care, [id number]- Manual therapy, [phone]- Gait training, [phone]- Canalith repositioning, Patient/Family education, Balance training, Stair training, and Vestibular training  PLAN FOR NEXT SESSION:  continue balance training - update  HEP as needed - some functional strength as needed  -activities related to dog and integration of compliant surfaces   -pulling cable machine, compliant surface, functional strength   -increasing challenge of balance systems to accound for loss of vestibular input   Lonni KATHEE Gainer, PT 05/26/2024, 3:03 PM        "

## 2024-05-28 ENCOUNTER — Ambulatory Visit: Admitting: Physical Therapy

## 2024-05-28 DIAGNOSIS — R269 Unspecified abnormalities of gait and mobility: Secondary | ICD-10-CM | POA: Diagnosis not present

## 2024-05-28 DIAGNOSIS — R2689 Other abnormalities of gait and mobility: Secondary | ICD-10-CM

## 2024-05-28 DIAGNOSIS — R262 Difficulty in walking, not elsewhere classified: Secondary | ICD-10-CM

## 2024-05-28 DIAGNOSIS — M6281 Muscle weakness (generalized): Secondary | ICD-10-CM

## 2024-05-28 DIAGNOSIS — R2681 Unsteadiness on feet: Secondary | ICD-10-CM

## 2024-05-28 NOTE — Therapy (Signed)
 " OUTPATIENT PHYSICAL THERAPY NEURO TREATMENT   Patient Name: Patrick Shepherd MRN: [id number] DOB:10/02/1945, 79 y.o., male Today's Date: 05/28/2024   PCP: Alla Amis, MD  REFERRING PROVIDER: Alla Amis, MD   END OF SESSION:  PT End of Session - 05/28/24 1320     Visit Number 4    Number of Visits 16    Date for Recertification  07/16/24    Progress Note Due on Visit 10    PT Start Time 1318    PT Stop Time 1358    PT Time Calculation (min) 40 min    Equipment Utilized During Treatment Gait belt    Activity Tolerance Patient tolerated treatment well    Behavior During Therapy WFL for tasks assessed/performed            Past Medical History:  Diagnosis Date   BPH (benign prostatic hyperplasia)    Diverticulosis    Dysplastic nevus 06/09/2021   right post deltoid- Severe, excised 07/19/21   GERD (gastroesophageal reflux disease)    GIB (gastrointestinal bleeding)    History of blood transfusion    History of SCC (squamous cell carcinoma) of skin 12/26/2017   left anterior shoulder   Hx of squamous cell carcinoma of skin 06/20/2017   R mid lateral forearm,   Macular hole    OAB (overactive bladder)    Sleep apnea    tested at [facility]   Spontaneous pneumothorax    Squamous cell carcinoma of skin 06/30/2017   right mid lat forearm   Squamous cell carcinoma of skin 04/16/2019   left post shoulder   Squamous cell carcinoma of skin 07/26/2021   right buccal cheek, EDC   Tobacco abuse    Past Surgical History:  Procedure Laterality Date   25 GAUGE PARS PLANA VITRECTOMY WITH 20 GAUGE MVR PORT FOR MACULAR HOLE Left 04/21/2014   Procedure: 25 GAUGE PARS PLANA VITRECTOMY WITH 20 GAUGE MVR PORT FOR MACULAR HOLE;  Surgeon: Norleen JONETTA Ku, MD;  Location: [facility] OR;  Service: Ophthalmology;  Laterality: Left;   collapsed lung     back in 1968   COLONOSCOPY     CYST EXCISION     from his back x 2   GAS/FLUID EXCHANGE Left 04/21/2014   Procedure: GAS/FLUID  EXCHANGE;  Surgeon: Norleen JONETTA Ku, MD;  Location: [facility] 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, right 03/01/2022   Combined arterial insufficiency and corporo-venous occlusive erectile dysfunction 01/29/2017   Mixed hyperlipidemia 11/29/2016   Smokes cigars 11/27/2016   OAB (overactive bladder) 01/26/2016   Rectal bleeding 11/24/2015   Chest pain 11/24/2015   Perineal abscess 11/24/2015   GIB (gastrointestinal bleeding) 11/24/2015   BPH (benign prostatic hyperplasia)    GERD (gastroesophageal reflux disease)    Sleep apnea    Diverticulosis    Macular hole, left eye 04/21/2014   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, 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   Diverticulosis of large intestine without hemorrhage 09/25/2012  Malignant neoplasm of prostate (HCC) 05/31/2012    ONSET DATE: 2 year ago  REFERRING DIAG: R26.89 (ICD-10-CM) - Balance problem   THERAPY DIAG:  Abnormality of gait and mobility  Difficulty in walking, not elsewhere classified  Muscle weakness (generalized)  Other abnormalities of gait and mobility  Unsteadiness on feet  Rationale for Evaluation and Treatment: Rehabilitation  SUBJECTIVE:                                                                                                                                                                                             SUBJECTIVE STATEMENT:  Patient reports doing well with no imbalance or issues since last visit. 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 Pt has had progressively worsening balance since the, likely due to his inner ear being removed. Pt has had one fall in the past 6 months due to his dog leash but he also has a lot of stumbles. 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 Pt has difficulty bending down to pick up droppings recently. Pt has difficulty with walking straight down the hallway. Pt has vertigo once every several years but is not the usual source of hi problems.   PAIN:  Are you having pain? No  PRECAUTIONS: Fall  RED FLAGS: None   WEIGHT BEARING RESTRICTIONS: No  FALLS: Has patient fallen in last 6 months? Yes. Number of falls 1  LIVING ENVIRONMENT: Lives with: lives with their family and lives with their spouse Lives in: House/apartment Stairs: Yes: External: 5 steps; can reach both Has following equipment at home: Single point cane and Walker - 4 wheeled  PLOF: Independent, Independent with basic ADLs, and Independent with household mobility with device  PATIENT GOALS: improve balance, be able to walk dog more confidently   OBJECTIVE:  Note: Objective measures were completed at Evaluation unless otherwise noted.  DIAGNOSTIC FINDINGS: n/a  COGNITION: Overall cognitive status: Within functional limits for tasks assessed       LOWER EXTREMITY MMT:    MMT Right Eval Left Eval  Hip flexion 4 4  Hip extension    Hip abduction    Hip adduction    Hip internal rotation    Hip external rotation    Knee flexion    Knee extension    Ankle dorsiflexion    Ankle plantarflexion  Ankle inversion    Ankle eversion    (Blank rows = not tested)  BED MOBILITY:  Not tested  TRANSFERS: Sit to stand: Complete Independence  Assistive device utilized: None      Stand to sit: Complete Independence  Assistive device utilized: None     Chair to chair: Complete Independence  Assistive device utilized: None       RAMP:  Not tested  CURB:  Not tested  STAIRS: Findings: Number of Stairs: 4, Height of Stairs: 6in   , and Comments: B UE  GAIT: Findings: Distance walked: 30 ft and Comments: slow speed, short step length   FUNCTIONAL TESTS:  5 times sit to stand: 24.47 sec 10 meter walk test: .21m/s Dynamic Gait Index: Dynamic Gait Index  Mark the lowest level that applies.       Score Interpretation: Score of <19 indicates high risk of falls.  Minimally Clinically Important Difference (MCID):  =DGI scores of<21/24 = 1.80 points DGI scores of >21/24 = 0.60 points   Dresser T, Inbar-Borovsky N, Brozgol M, Giladi N, [place] JM. The Dynamic Gait Index in healthy older adults: the role of stair climbing, fear of falling and gender. Gait Posture. 2009 Feb;29(2):237-41. doi: 10.1016/j.gaitpost.2008.08.013. Epub 2008 Oct 8. PMID: 81154560; PMCID: EFR7290501.  Pardasaney, MYRTIS LOIS Bonus, GEANNIE POUR., et al. (2012). Sensitivity to change and responsiveness of four balance measures for community-dwelling older adults. Physical therapy 92(3): 388-397.   PATIENT SURVEYS:  ABC scale: The Activities-Specific Balance Confidence (ABC) Scale 0% 10 20 30  40 50 60 70 80 90 100% No confidence<->completely confident  How confident are you that you will not lose your balance or become unsteady when you . . .   ABC scale: The Activities-Specific Balance Confidence (ABC) Scale   No confidence<->completely confident     How confident are you that you will not lose your balance or become unsteady when you . . .       Date tested    1: Walk around the house 90   2. Walk up or down stairs 70   3. Bend over and pick up a slipper from in front of a closet floor 70   4. Reach for a small can off a shelf at eye level 100   5. Stand on tip toes and reach for  something above your head 90   6. Stand on a chair and reach for something 20   7. Sweep the floor 90   8. Walk outside the house to a car parked in the driveway 100   9. Get into or out of a car 100   10. Walk across a parking lot to the mall 80   11. Walk up or down a ramp 50   12. Walk in a crowded mall where people rapidly walk past you 80   13. Are bumped into by people as you walk through the mall 60   14. Step onto or off of an escalator while you are holding onto the railing 60   15. Step onto or off an escalator while holding onto parcels such that you cannot hold onto the railing 40   16. Walk outside on icy sidewalks 0   Total: #/16 1100 0.6875    68.75 %  TREATMENT DATE: 05/28/2024      NMR: To facilitate reeducation of movement, balance, posture, coordination, and/or proprioception/kinesthetic sense.  Sidestepping over various obstacles in // bars no UE support x 5 laps (1 in obstacles/  small)  Airex pads 1 foot on each and wide tandem stance 3 x 30 each lower extremity  EC balance on airex pad narrow BOS 3 x 30 sec   Dog walking simulation - retro and forward walk with cable machine x 3 reps holding rope attachment in ea LE (6 total) with 12.5# -17.5# resistance another 3 reps ea    Gait over 4 Airex is in a row with 2 hurdles placed in between x 6 laps Send course as above but x 2 laps with sidestepping only  PATIENT EDUCATION: Education details: POC Person educated: Patient Education method: Explanation Education comprehension: verbalized understanding   HOME EXERCISE PROGRAM: Establish visit 2    GOALS: Goals reviewed with patient? Yes  SHORT TERM GOALS: Target date: 06/18/2024   Patient will be independent in home exercise program to improve strength/mobility for better functional independence with ADLs. Baseline: No HEP  currently  Goal status: INITIAL  LONG TERM GOALS: Target date: 07/16/2024   1.  Patient will complete five times sit to stand test in < 15 seconds indicating an increased LE strength and improved balance. Baseline: 24.47 sec no UE Goal status: INITIAL  2.  Patient will improve ABC score to 78%   to demonstrate statistically significant improvement in mobility and quality of life as it relates to their Balance and mobility Goal status: INITIAL   3.  Patient will increase DGI Balance score by > 6 points to demonstrate decreased fall risk during functional activities. Baseline: 12 Goal status: INITIAL    4. Patient will increase 10 meter walk test to >1.51m/s as to improve gait speed for better community ambulation and to reduce fall risk. Baseline: .78 m/s Goal status: INITIAL  5.   Patient will demonstrate ability to bend down to floor level and pick up objects x 10 repetitions on unstable or unlevel surface to indicate improved ability to care for dog. Baseline: Not confident doing this at eval per report Goal status: INITIAL    ASSESSMENT:  CLINICAL IMPRESSION:  The patients balance impairments appear to be significantly influenced by long-standing deficits secondary to his prior right ear canal surgery, which has likely reduced vestibular input and impaired his ability to generate appropriate postural and balance reactions. Throughout therapy, he continues to demonstrate challenges with dynamic and functional balance tasks, particularly those requiring rapid shifts in attention, changes in surface conditions, and external environmental distractions. Functional activities designed to simulate walking his dog including managing directional changes, variable leash tension,revealed notable instability and delayed balance responses. These deficits highlight limitations in his adaptability and reactive balance strategies, which are critical for completing this meaningful daily activity  safely. Overall, his persistent impairments place him at increased fall risk and continue to limit his confidence and independence in community and household mobility.  OBJECTIVE IMPAIRMENTS: Abnormal gait, decreased activity tolerance, decreased balance, and decreased strength.   ACTIVITY LIMITATIONS: squatting, stairs, and locomotion level  PARTICIPATION LIMITATIONS: community activity and yard work  PERSONAL FACTORS: Age, Time since onset of injury/illness/exacerbation, and 1 comorbidity: R ear canal removal due to cancerous mass  are also affecting patient's functional outcome.   REHAB POTENTIAL: Good  CLINICAL DECISION MAKING: Stable/uncomplicated  EVALUATION COMPLEXITY: Low  PLAN:  PT FREQUENCY: 2x/week  PT DURATION: 8 weeks  PLANNED INTERVENTIONS: 97750- Physical Performance Testing, 97110-Therapeutic exercises, 97530- Therapeutic activity, 97112- Neuromuscular re-education, [phone]- Self Care, [id number]- Manual therapy, [phone]- Gait training, [phone]- Canalith repositioning, Patient/Family education, Balance training, Stair training, and Vestibular training  PLAN FOR NEXT SESSION:  continue balance training - update  HEP as needed - some functional strength as needed  -activities related to dog and integration of compliant surfaces   -pulling cable machine, compliant surface, functional strength   -increasing challenge of balance systems to accound for loss of vestibular input   Lonni KATHEE Gainer, PT 05/28/2024, 1:21 PM        "

## 2024-06-02 ENCOUNTER — Ambulatory Visit: Admitting: Physical Therapy

## 2024-06-02 DIAGNOSIS — R262 Difficulty in walking, not elsewhere classified: Secondary | ICD-10-CM

## 2024-06-02 DIAGNOSIS — R269 Unspecified abnormalities of gait and mobility: Secondary | ICD-10-CM | POA: Diagnosis not present

## 2024-06-02 DIAGNOSIS — R2681 Unsteadiness on feet: Secondary | ICD-10-CM

## 2024-06-02 DIAGNOSIS — R2689 Other abnormalities of gait and mobility: Secondary | ICD-10-CM

## 2024-06-02 DIAGNOSIS — M6281 Muscle weakness (generalized): Secondary | ICD-10-CM

## 2024-06-02 NOTE — Therapy (Signed)
 " OUTPATIENT PHYSICAL THERAPY NEURO TREATMENT   Patient Name: Patrick Shepherd MRN: [id number] DOB:[birth year], 79 y.o., male Today's Date: 06/02/2024   PCP: Alla Amis, MD  REFERRING PROVIDER: Alla Amis, MD   END OF SESSION:  PT End of Session - 06/02/24 1307     Visit Number 5    Number of Visits 16    Date for Recertification  07/16/24    Progress Note Due on Visit 10    PT Start Time 1315    PT Stop Time 1357    PT Time Calculation (min) 42 min    Equipment Utilized During Treatment Gait belt    Activity Tolerance Patient tolerated treatment well    Behavior During Therapy WFL for tasks assessed/performed             Past Medical History:  Diagnosis Date   BPH (benign prostatic hyperplasia)    Diverticulosis    Dysplastic nevus 06/09/2021   right post deltoid- Severe, excised 07/19/21   GERD (gastroesophageal reflux disease)    GIB (gastrointestinal bleeding)    History of blood transfusion    History of SCC (squamous cell carcinoma) of skin 12/26/2017   left anterior shoulder   Hx of squamous cell carcinoma of skin 06/20/2017   R mid lateral forearm,   Macular hole    OAB (overactive bladder)    Sleep apnea    tested at [facility]   Spontaneous pneumothorax    Squamous cell carcinoma of skin 06/30/2017   right mid lat forearm   Squamous cell carcinoma of skin 04/16/2019   left post shoulder   Squamous cell carcinoma of skin 07/26/2021   right buccal cheek, EDC   Tobacco abuse    Past Surgical History:  Procedure Laterality Date   25 GAUGE PARS PLANA VITRECTOMY WITH 20 GAUGE MVR PORT FOR MACULAR HOLE Left 04/21/2014   Procedure: 25 GAUGE PARS PLANA VITRECTOMY WITH 20 GAUGE MVR PORT FOR MACULAR HOLE;  Surgeon: Norleen JONETTA Ku, MD;  Location: [facility] OR;  Service: Ophthalmology;  Laterality: Left;   collapsed lung     back in 1968   COLONOSCOPY     CYST EXCISION     from his back x 2   GAS/FLUID EXCHANGE Left 04/21/2014   Procedure: GAS/FLUID  EXCHANGE;  Surgeon: Norleen JONETTA Ku, MD;  Location: [facility] OR;  Service: Ophthalmology;  Laterality: Left;   MEMBRANE PEEL Left 04/21/2014   Procedure: MEMBRANE PEEL;  Surgeon: Norleen JONETTA Ku, MD;  Location: [facility] OR;  Service: Ophthalmology;  Laterality: Left;   SERUM PATCH Left 04/21/2014   Procedure: SERUM PATCH;  Surgeon: Norleen JONETTA Ku, MD;  Location: [facility] OR;  Service: Ophthalmology;  Laterality: Left;   Patient Active Problem List   Diagnosis Date Noted   Hx of squamous cell carcinoma of skin 07/17/2022   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, right 03/01/2022   Combined arterial insufficiency and corporo-venous occlusive erectile dysfunction 01/29/2017   Mixed hyperlipidemia 11/29/2016   Smokes cigars 11/27/2016   OAB (overactive bladder) 01/26/2016   Rectal bleeding 11/24/2015   Chest pain 11/24/2015   Perineal abscess 11/24/2015   GIB (gastrointestinal bleeding) 11/24/2015   BPH (benign prostatic hyperplasia)    GERD (gastroesophageal reflux disease)    Sleep apnea    Diverticulosis    Macular hole, left eye 04/21/2014   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, 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   Diverticulosis of large intestine without hemorrhage 09/25/2012  Malignant neoplasm of prostate (HCC) 05/31/2012    ONSET DATE: 2 year ago  REFERRING DIAG: R26.89 (ICD-10-CM) - Balance problem   THERAPY DIAG:  Abnormality of gait and mobility  Difficulty in walking, not elsewhere classified  Muscle weakness (generalized)  Other abnormalities of gait and mobility  Unsteadiness on feet  Rationale for Evaluation and Treatment: Rehabilitation  SUBJECTIVE:                                                                                                                                                                                             SUBJECTIVE STATEMENT:  Patient reports doing well with no imbalance or issues since last visit. HEP has been getting easier  with exception of EC balance.   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 Pt has had progressively worsening balance since the, likely due to his inner ear being removed. Pt has had one fall in the past 6 months due to his dog leash but he also has a lot of stumbles. 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 Pt has difficulty bending down to pick up droppings recently. Pt has difficulty with walking straight down the hallway. Pt has vertigo once every several years but is not the usual source of hi problems.   PAIN:  Are you having pain? No  PRECAUTIONS: Fall  RED FLAGS: None   WEIGHT BEARING RESTRICTIONS: No  FALLS: Has patient fallen in last 6 months? Yes. Number of falls 1  LIVING ENVIRONMENT: Lives with: lives with their family and lives with their spouse Lives in: House/apartment Stairs: Yes: External: 5 steps; can reach both Has following equipment at home: Single point cane and Walker - 4 wheeled  PLOF: Independent, Independent with basic ADLs, and Independent with household mobility with device  PATIENT GOALS: improve balance, be able to walk dog more confidently   OBJECTIVE:  Note: Objective measures were completed at Evaluation unless otherwise noted.  DIAGNOSTIC FINDINGS: n/a  COGNITION: Overall cognitive status: Within functional limits for tasks assessed       LOWER EXTREMITY MMT:    MMT Right Eval Left Eval  Hip flexion 4 4  Hip extension    Hip abduction    Hip adduction    Hip internal rotation    Hip external rotation    Knee flexion    Knee extension  Ankle dorsiflexion    Ankle plantarflexion    Ankle inversion    Ankle eversion    (Blank rows = not tested)  BED MOBILITY:  Not tested  TRANSFERS: Sit to stand:  Complete Independence  Assistive device utilized: None     Stand to sit: Complete Independence  Assistive device utilized: None     Chair to chair: Complete Independence  Assistive device utilized: None       RAMP:  Not tested  CURB:  Not tested  STAIRS: Findings: Number of Stairs: 4, Height of Stairs: 6in   , and Comments: B UE  GAIT: Findings: Distance walked: 30 ft and Comments: slow speed, short step length   FUNCTIONAL TESTS:  5 times sit to stand: 24.47 sec 10 meter walk test: .37m/s Dynamic Gait Index: Dynamic Gait Index  Mark the lowest level that applies.       Score Interpretation: Score of <19 indicates high risk of falls.  Minimally Clinically Important Difference (MCID):  =DGI scores of<21/24 = 1.80 points DGI scores of >21/24 = 0.60 points   [place] T, Inbar-Borovsky N, Brozgol M, Giladi N, [place] JM. The Dynamic Gait Index in healthy older adults: the role of stair climbing, fear of falling and gender. Gait Posture. 2009 Feb;29(2):237-41. doi: 10.1016/j.gaitpost.2008.08.013. Epub 2008 Oct 8. PMID: 81154560; PMCID: EFR7290501.  Pardasaney, MYRTIS LOIS Bonus, GEANNIE POUR., et al. (2012). Sensitivity to change and responsiveness of four balance measures for community-dwelling older adults. Physical therapy 92(3): 388-397.   PATIENT SURVEYS:  ABC scale: The Activities-Specific Balance Confidence (ABC) Scale 0% 10 20 30  40 50 60 70 80 90 100% No confidence<->completely confident  How confident are you that you will not lose your balance or become unsteady when you . . .   ABC scale: The Activities-Specific Balance Confidence (ABC) Scale   No confidence<->completely confident     How confident are you that you will not lose your balance or become unsteady when you . . .       Date tested    1: Walk around the house 90   2. Walk up or down stairs 70   3. Bend over and pick up a slipper from in front of a closet floor 70   4. Reach for a small can off a shelf at  eye level 100   5. Stand on tip toes and reach for something above your head 90   6. Stand on a chair and reach for something 20   7. Sweep the floor 90   8. Walk outside the house to a car parked in the driveway 100   9. Get into or out of a car 100   10. Walk across a parking lot to the mall 80   11. Walk up or down a ramp 50   12. Walk in a crowded mall where people rapidly walk past you 80   13. Are bumped into by people as you walk through the mall 60   14. Step onto or off of an escalator while you are holding onto the railing 60   15. Step onto or off an escalator while holding onto parcels such that you cannot hold onto the railing 40   16. Walk outside on icy sidewalks 0   Total: #/16 1100 0.6875    68.75 %  TREATMENT DATE: 06/02/24   NMR: To facilitate reeducation of movement, balance, posture, coordination, and/or proprioception/kinesthetic sense.  Updated HEP as follows - provided education on proper form with each round  Exercises - Standing eyes closed at supportive surface   - 1 x daily - 7 x weekly - 3 sets - 30 second  hold - Standing Heel Raise  - 1 x daily - 7 x weekly - 3 sets - 12 reps - Toe Raises with Counter Support  - 1 x daily - 7 x weekly - 3 sets - 10 reps - Tandem balance at [employer] Support   - 1 x daily - 7 x weekly - 3 sets - 30 seconds hold   Sidestepping over various obstacles in step to pattern then another round in step through pattern x 4 laps ea  *1 lap = 1 time each way to return to start point  EC balance on airex pad narrow BOS 3 x 30 sec   Dog walking simulation - retro and forward walk with cable machine 2 x 3 reps using rope attachment - had 3 1/2 foam rollers to step on / over like uneven terrain   Sidestepping on and off airex x 10 ea side without UE support     PATIENT EDUCATION: Access Code:  [email or web address] URL: [email or web address] Date: 06/02/2024 Prepared by: Lonni Gainer  Exercises - Standing eyes closed at supportive surface   - 1 x daily - 7 x weekly - 3 sets - 30 second  hold - Standing Heel Raise  - 1 x daily - 7 x weekly - 3 sets - 12 reps - Toe Raises with Counter Support  - 1 x daily - 7 x weekly - 3 sets - 10 reps - Tandem balance at Counter Support   - 1 x daily - 7 x weekly - 3 sets - 30 seconds hold   HOME EXERCISE PROGRAM:   GOALS: Goals reviewed with patient? Yes  SHORT TERM GOALS: Target date: 06/18/2024   Patient will be independent in home exercise program to improve strength/mobility for better functional independence with ADLs. Baseline: No HEP currently  Goal status: INITIAL  LONG TERM GOALS: Target date: 07/16/2024   1.  Patient will complete five times sit to stand test in < 15 seconds indicating an increased LE strength and improved balance. Baseline: 24.47 sec no UE Goal status: INITIAL  2.  Patient will improve ABC score to 78%   to demonstrate statistically significant improvement in mobility and quality of life as it relates to their Balance and mobility Goal status: INITIAL   3.  Patient will increase DGI Balance score by > 6 points to demonstrate decreased fall risk during functional activities. Baseline: 12 Goal status: INITIAL    4. Patient will increase 10 meter walk test to >1.84m/s as to improve gait speed for better community ambulation and to reduce fall risk. Baseline: .78 m/s Goal status: INITIAL  5.   Patient will demonstrate ability to bend down to floor level and pick up objects x 10 repetitions on unstable or unlevel surface to indicate improved ability to care for dog. Baseline: Not confident doing this at eval per report Goal status: INITIAL    ASSESSMENT:  CLINICAL IMPRESSION:  The patients balance impairments appear to be significantly influenced by long-standing deficits secondary to his prior  right ear canal surgery, which has likely reduced vestibular input and impaired his ability to generate appropriate postural and balance reactions. . Functional activities designed  to simulate walking his dog including managing directional changes, variable leash tension,revealed notable instability and delayed balance responses. These deficits highlight limitations in his adaptability and reactive balance strategies, which are critical for completing this meaningful daily activity safely.  Patient progressed with home exercise program noting the exercises been getting easier and easier at home indicating functional improvement in balance responses since initial evaluation.  Provided with new and updated home program today to further challenge and attenuate progress overall, his persistent impairments place him at increased fall risk and continue to limit his confidence and independence in community and household mobility.  OBJECTIVE IMPAIRMENTS: Abnormal gait, decreased activity tolerance, decreased balance, and decreased strength.   ACTIVITY LIMITATIONS: squatting, stairs, and locomotion level  PARTICIPATION LIMITATIONS: community activity and yard work  PERSONAL FACTORS: Age, Time since onset of injury/illness/exacerbation, and 1 comorbidity: R ear canal removal due to cancerous mass  are also affecting patient's functional outcome.   REHAB POTENTIAL: Good  CLINICAL DECISION MAKING: Stable/uncomplicated  EVALUATION COMPLEXITY: Low  PLAN:  PT FREQUENCY: 2x/week  PT DURATION: 8 weeks  PLANNED INTERVENTIONS: 97750- Physical Performance Testing, 97110-Therapeutic exercises, 97530- Therapeutic activity, 97112- Neuromuscular re-education, 97535- Self Care, [id number]- Manual therapy, [phone]- Gait training, [phone]- Canalith repositioning, Patient/Family education, Balance training, Stair training, and Vestibular training  PLAN FOR NEXT SESSION:  continue balance training - update  HEP as needed - some  functional strength as needed  -activities related to dog and integration of compliant surfaces   -pulling cable machine, compliant surface, functional strength   -increasing challenge of balance systems to accound for loss of vestibular input   Lonni KATHEE Gainer, PT 06/02/2024, 1:08 PM        "

## 2024-06-04 ENCOUNTER — Ambulatory Visit: Admitting: Physical Therapy

## 2024-06-04 DIAGNOSIS — R2689 Other abnormalities of gait and mobility: Secondary | ICD-10-CM

## 2024-06-04 DIAGNOSIS — R2681 Unsteadiness on feet: Secondary | ICD-10-CM

## 2024-06-04 DIAGNOSIS — M6281 Muscle weakness (generalized): Secondary | ICD-10-CM

## 2024-06-04 DIAGNOSIS — R269 Unspecified abnormalities of gait and mobility: Secondary | ICD-10-CM | POA: Diagnosis not present

## 2024-06-04 DIAGNOSIS — R262 Difficulty in walking, not elsewhere classified: Secondary | ICD-10-CM

## 2024-06-04 NOTE — Therapy (Signed)
 " OUTPATIENT PHYSICAL THERAPY NEURO TREATMENT   Patient Name: Patrick Shepherd MRN: [id number] DOB:[birth year], 79 y.o., male Today's Date: 06/04/2024   PCP: Alla Amis, MD  REFERRING PROVIDER: Alla Amis, MD   END OF SESSION:  PT End of Session - 06/04/24 1319     Visit Number 6    Number of Visits 16    Date for Recertification  07/16/24    Progress Note Due on Visit 10    PT Start Time 1317    PT Stop Time 1357    PT Time Calculation (min) 40 min    Equipment Utilized During Treatment Gait belt    Activity Tolerance Patient tolerated treatment well    Behavior During Therapy WFL for tasks assessed/performed             Past Medical History:  Diagnosis Date   BPH (benign prostatic hyperplasia)    Diverticulosis    Dysplastic nevus 06/09/2021   right post deltoid- Severe, excised 07/19/21   GERD (gastroesophageal reflux disease)    GIB (gastrointestinal bleeding)    History of blood transfusion    History of SCC (squamous cell carcinoma) of skin 12/26/2017   left anterior shoulder   Hx of squamous cell carcinoma of skin 06/20/2017   R mid lateral forearm,   Macular hole    OAB (overactive bladder)    Sleep apnea    tested at [facility]   Spontaneous pneumothorax    Squamous cell carcinoma of skin 06/30/2017   right mid lat forearm   Squamous cell carcinoma of skin 04/16/2019   left post shoulder   Squamous cell carcinoma of skin 07/26/2021   right buccal cheek, EDC   Tobacco abuse    Past Surgical History:  Procedure Laterality Date   25 GAUGE PARS PLANA VITRECTOMY WITH 20 GAUGE MVR PORT FOR MACULAR HOLE Left 04/21/2014   Procedure: 25 GAUGE PARS PLANA VITRECTOMY WITH 20 GAUGE MVR PORT FOR MACULAR HOLE;  Surgeon: Norleen JONETTA Ku, MD;  Location: [facility] OR;  Service: Ophthalmology;  Laterality: Left;   collapsed lung     back in 1968   COLONOSCOPY     CYST EXCISION     from his back x 2   GAS/FLUID EXCHANGE Left 04/21/2014   Procedure: GAS/FLUID  EXCHANGE;  Surgeon: Norleen JONETTA Ku, MD;  Location: [facility] OR;  Service: Ophthalmology;  Laterality: Left;   MEMBRANE PEEL Left 04/21/2014   Procedure: MEMBRANE PEEL;  Surgeon: Norleen JONETTA Ku, MD;  Location: [facility] OR;  Service: Ophthalmology;  Laterality: Left;   SERUM PATCH Left 04/21/2014   Procedure: SERUM PATCH;  Surgeon: Norleen JONETTA Ku, MD;  Location: 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, right 03/01/2022   Combined arterial insufficiency and corporo-venous occlusive erectile dysfunction 01/29/2017   Mixed hyperlipidemia 11/29/2016   Smokes cigars 11/27/2016   OAB (overactive bladder) 01/26/2016   Rectal bleeding 11/24/2015   Chest pain 11/24/2015   Perineal abscess 11/24/2015   GIB (gastrointestinal bleeding) 11/24/2015   BPH (benign prostatic hyperplasia)    GERD (gastroesophageal reflux disease)    Sleep apnea    Diverticulosis    Macular hole, left eye 04/21/2014   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, 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   Diverticulosis of large intestine without hemorrhage 09/25/2012  Malignant neoplasm of prostate (HCC) 05/31/2012    ONSET DATE: 2 year ago  REFERRING DIAG: R26.89 (ICD-10-CM) - Balance problem   THERAPY DIAG:  Abnormality of gait and mobility  Difficulty in walking, not elsewhere classified  Muscle weakness (generalized)  Unsteadiness on feet  Other abnormalities of gait and mobility  Rationale for Evaluation and Treatment: Rehabilitation  SUBJECTIVE:                                                                                                                                                                                             SUBJECTIVE STATEMENT:  Patient reports doing well with no imbalance or issues since last visit. HEP has been getting easier  with exception of EC balance.   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 Pt has had progressively worsening balance since the, likely due to his inner ear being removed. Pt has had one fall in the past 6 months due to his dog leash but he also has a lot of stumbles. 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 Pt has difficulty bending down to pick up droppings recently. Pt has difficulty with walking straight down the hallway. Pt has vertigo once every several years but is not the usual source of hi problems.   PAIN:  Are you having pain? No  PRECAUTIONS: Fall  RED FLAGS: None   WEIGHT BEARING RESTRICTIONS: No  FALLS: Has patient fallen in last 6 months? Yes. Number of falls 1  LIVING ENVIRONMENT: Lives with: lives with their family and lives with their spouse Lives in: House/apartment Stairs: Yes: External: 5 steps; can reach both Has following equipment at home: Single point cane and Walker - 4 wheeled  PLOF: Independent, Independent with basic ADLs, and Independent with household mobility with device  PATIENT GOALS: improve balance, be able to walk dog more confidently   OBJECTIVE:  Note: Objective measures were completed at Evaluation unless otherwise noted.  DIAGNOSTIC FINDINGS: n/a  COGNITION: Overall cognitive status: Within functional limits for tasks assessed       LOWER EXTREMITY MMT:    MMT Right Eval Left Eval  Hip flexion 4 4  Hip extension    Hip abduction    Hip adduction    Hip internal rotation    Hip external rotation    Knee flexion    Knee extension  Ankle dorsiflexion    Ankle plantarflexion    Ankle inversion    Ankle eversion    (Blank rows = not tested)  BED MOBILITY:  Not tested  TRANSFERS: Sit to stand:  Complete Independence  Assistive device utilized: None     Stand to sit: Complete Independence  Assistive device utilized: None     Chair to chair: Complete Independence  Assistive device utilized: None       RAMP:  Not tested  CURB:  Not tested  STAIRS: Findings: Number of Stairs: 4, Height of Stairs: 6in   , and Comments: B UE  GAIT: Findings: Distance walked: 30 ft and Comments: slow speed, short step length   FUNCTIONAL TESTS:  5 times sit to stand: 24.47 sec 10 meter walk test: .22m/s Dynamic Gait Index: Dynamic Gait Index  Mark the lowest level that applies.       Score Interpretation: Score of <19 indicates high risk of falls.  Minimally Clinically Important Difference (MCID):  =DGI scores of<21/24 = 1.80 points DGI scores of >21/24 = 0.60 points   Chatham T, Inbar-Borovsky N, Brozgol M, Giladi N, [place] JM. The Dynamic Gait Index in healthy older adults: the role of stair climbing, fear of falling and gender. Gait Posture. 2009 Feb;29(2):237-41. doi: 10.1016/j.gaitpost.2008.08.013. Epub 2008 Oct 8. PMID: 81154560; PMCID: EFR7290501.  Pardasaney, MYRTIS LOIS Bonus, GEANNIE POUR., et al. (2012). Sensitivity to change and responsiveness of four balance measures for community-dwelling older adults. Physical therapy 92(3): 388-397.   PATIENT SURVEYS:  ABC scale: The Activities-Specific Balance Confidence (ABC) Scale 0% 10 20 30  40 50 60 70 80 90 100% No confidence<->completely confident  How confident are you that you will not lose your balance or become unsteady when you . . .   ABC scale: The Activities-Specific Balance Confidence (ABC) Scale   No confidence<->completely confident     How confident are you that you will not lose your balance or become unsteady when you . . .       Date tested    1: Walk around the house 90   2. Walk up or down stairs 70   3. Bend over and pick up a slipper from in front of a closet floor 70   4. Reach for a small can off a shelf at  eye level 100   5. Stand on tip toes and reach for something above your head 90   6. Stand on a chair and reach for something 20   7. Sweep the floor 90   8. Walk outside the house to a car parked in the driveway 100   9. Get into or out of a car 100   10. Walk across a parking lot to the mall 80   11. Walk up or down a ramp 50   12. Walk in a crowded mall where people rapidly walk past you 80   13. Are bumped into by people as you walk through the mall 60   14. Step onto or off of an escalator while you are holding onto the railing 60   15. Step onto or off an escalator while holding onto parcels such that you cannot hold onto the railing 40   16. Walk outside on icy sidewalks 0   Total: #/16 1100 0.6875    68.75 %  TREATMENT DATE: 06/04/24    NMR: To facilitate reeducation of movement, balance, posture, coordination, and/or proprioception/kinesthetic sense. Particular focus on challenging all 3 balance systems to sharpen each due to his vestibular on R side.   Wide tandem on airex pads 3 x 30 sec ea LE   Sidestepping on and off airex x 10 ea side without UE support   EC balance on airex pad narrow BOS 3 x 30 sec   1 LE on floor other on 6 in step 3 x 30 sec ea LE   Gait with EC and feedback throughout and after each rep x 8 reps- inconsistent drift and incorrect perception of drift direction. Did improve somewhat with increased repetitions.   Stance on rocker board a/p direction - 3 x 30 sec - improved with practice from very unsteady to moderately unsteady     PATIENT EDUCATION: Access Code: [email or web address] URL: [email or web address] Date: 06/02/2024 Prepared by: Lonni Gainer  Exercises - Standing eyes closed at supportive surface   - 1 x daily - 7 x weekly - 3 sets - 30 second  hold - Standing Heel Raise  - 1 x daily - 7 x weekly -  3 sets - 12 reps - Toe Raises with Counter Support  - 1 x daily - 7 x weekly - 3 sets - 10 reps - Tandem balance at Counter Support   - 1 x daily - 7 x weekly - 3 sets - 30 seconds hold   HOME EXERCISE PROGRAM:   GOALS: Goals reviewed with patient? Yes  SHORT TERM GOALS: Target date: 06/18/2024   Patient will be independent in home exercise program to improve strength/mobility for better functional independence with ADLs. Baseline: No HEP currently  Goal status: INITIAL  LONG TERM GOALS: Target date: 07/16/2024   1.  Patient will complete five times sit to stand test in < 15 seconds indicating an increased LE strength and improved balance. Baseline: 24.47 sec no UE Goal status: INITIAL  2.  Patient will improve ABC score to 78%   to demonstrate statistically significant improvement in mobility and quality of life as it relates to their Balance and mobility Goal status: INITIAL   3.  Patient will increase DGI Balance score by > 6 points to demonstrate decreased fall risk during functional activities. Baseline: 12 Goal status: INITIAL    4. Patient will increase 10 meter walk test to >1.30m/s as to improve gait speed for better community ambulation and to reduce fall risk. Baseline: .78 m/s Goal status: INITIAL  5.   Patient will demonstrate ability to bend down to floor level and pick up objects x 10 repetitions on unstable or unlevel surface to indicate improved ability to care for dog. 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 He demonstrated notable difficulty with tasks requiring vestibular reliance, as seen through inconsistent drift during eyes-closed gait and unsteadiness during activities on compliant surfaces. However, he showed measurable improvement with repetition, particularly on the  rocker board where performance progressed from very unsteady to moderately unsteady. Neuromuscular reeducation focused on challenging visual, somatosensory, and vestibular systems to promote improved postural control and recalibration of sensory organization.  Continued physical therapy is beneficial because the patient is demonstrating clear potential for improvement with structured practice and guided feedback. Ongoing skilled intervention will help further refine his sensory integration,  reduce fall risk, and enhance his ability to compensate for right vestibular loss. Regular progression of balance challenges and functional tasks will support greater stability, confidence, and safety during ambulation and daily activities.   OBJECTIVE IMPAIRMENTS: Abnormal gait, decreased activity tolerance, decreased balance, and decreased strength.   ACTIVITY LIMITATIONS: squatting, stairs, and locomotion level  PARTICIPATION LIMITATIONS: community activity and yard work  PERSONAL FACTORS: Age, Time since onset of injury/illness/exacerbation, and 1 comorbidity: R ear canal removal due to cancerous mass  are also affecting patient's functional outcome.   REHAB POTENTIAL: Good  CLINICAL DECISION MAKING: Stable/uncomplicated  EVALUATION COMPLEXITY: Low  PLAN:  PT FREQUENCY: 2x/week  PT DURATION: 8 weeks  PLANNED INTERVENTIONS: 97750- Physical Performance Testing, 97110-Therapeutic exercises, 97530- Therapeutic activity, 97112- Neuromuscular re-education, 97535- Self Care, [id number]- Manual therapy, [phone]- Gait training, [phone]- Canalith repositioning, Patient/Family education, Balance training, Stair training, and Vestibular training  PLAN FOR NEXT SESSION:  continue balance training - update  HEP as needed - some functional strength as needed  -activities related to dog and integration of compliant surfaces   -pulling cable machine, compliant surface, functional strength   -increasing challenge of  balance systems to accound for loss of vestibular input   Lonni KATHEE Gainer, PT 06/04/2024, 1:20 PM        "

## 2024-06-09 ENCOUNTER — Ambulatory Visit: Admitting: Physical Therapy

## 2024-06-11 ENCOUNTER — Ambulatory Visit: Admitting: Physical Therapy

## 2024-06-11 DIAGNOSIS — R269 Unspecified abnormalities of gait and mobility: Secondary | ICD-10-CM

## 2024-06-11 DIAGNOSIS — R2689 Other abnormalities of gait and mobility: Secondary | ICD-10-CM

## 2024-06-11 DIAGNOSIS — R2681 Unsteadiness on feet: Secondary | ICD-10-CM

## 2024-06-11 DIAGNOSIS — R262 Difficulty in walking, not elsewhere classified: Secondary | ICD-10-CM

## 2024-06-11 DIAGNOSIS — M6281 Muscle weakness (generalized): Secondary | ICD-10-CM

## 2024-06-11 NOTE — Therapy (Signed)
 " OUTPATIENT PHYSICAL THERAPY NEURO TREATMENT   Patient Name: Patrick Shepherd MRN: [id number] DOB:03/14/1946, 79 y.o., male Today's Date: 06/11/2024   PCP: Alla Amis, MD  REFERRING PROVIDER: Alla Amis, MD   END OF SESSION:  PT End of Session - 06/11/24 1320     Visit Number 7    Number of Visits 16    Date for Recertification  07/16/24    Progress Note Due on Visit 10    PT Start Time 1317    PT Stop Time 1357    PT Time Calculation (min) 40 min    Equipment Utilized During Treatment Gait belt    Activity Tolerance Patient tolerated treatment well    Behavior During Therapy WFL for tasks assessed/performed              Past Medical History:  Diagnosis Date   BPH (benign prostatic hyperplasia)    Diverticulosis    Dysplastic nevus 06/09/2021   right post deltoid- Severe, excised 07/19/21   GERD (gastroesophageal reflux disease)    GIB (gastrointestinal bleeding)    History of blood transfusion    History of SCC (squamous cell carcinoma) of skin 12/26/2017   left anterior shoulder   Hx of squamous cell carcinoma of skin 06/20/2017   R mid lateral forearm,   Macular hole    OAB (overactive bladder)    Sleep apnea    tested at [facility]   Spontaneous pneumothorax    Squamous cell carcinoma of skin 06/30/2017   right mid lat forearm   Squamous cell carcinoma of skin 04/16/2019   left post shoulder   Squamous cell carcinoma of skin 07/26/2021   right buccal cheek, EDC   Tobacco abuse    Past Surgical History:  Procedure Laterality Date   25 GAUGE PARS PLANA VITRECTOMY WITH 20 GAUGE MVR PORT FOR MACULAR HOLE Left 04/21/2014   Procedure: 25 GAUGE PARS PLANA VITRECTOMY WITH 20 GAUGE MVR PORT FOR MACULAR HOLE;  Surgeon: Norleen JONETTA Ku, MD;  Location: [facility] OR;  Service: Ophthalmology;  Laterality: Left;   collapsed lung     back in 1968   COLONOSCOPY     CYST EXCISION     from his back x 2   GAS/FLUID EXCHANGE Left 04/21/2014   Procedure: GAS/FLUID  EXCHANGE;  Surgeon: Norleen JONETTA Ku, MD;  Location: [facility] OR;  Service: Ophthalmology;  Laterality: Left;   MEMBRANE PEEL Left 04/21/2014   Procedure: MEMBRANE PEEL;  Surgeon: Norleen JONETTA Ku, MD;  Location: [facility] OR;  Service: Ophthalmology;  Laterality: Left;   SERUM PATCH Left 04/21/2014   Procedure: SERUM PATCH;  Surgeon: Norleen JONETTA Ku, MD;  Location: [facility] OR;  Service: Ophthalmology;  Laterality: Left;   Patient Active Problem List   Diagnosis Date Noted   Hx of squamous cell carcinoma of skin 07/17/2022   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, right 03/01/2022   Combined arterial insufficiency and corporo-venous occlusive erectile dysfunction 01/29/2017   Mixed hyperlipidemia 11/29/2016   Smokes cigars 11/27/2016   OAB (overactive bladder) 01/26/2016   Rectal bleeding 11/24/2015   Chest pain 11/24/2015   Perineal abscess 11/24/2015   GIB (gastrointestinal bleeding) 11/24/2015   BPH (benign prostatic hyperplasia)    GERD (gastroesophageal reflux disease)    Sleep apnea    Diverticulosis    Macular hole, left eye 04/21/2014   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, 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   Diverticulosis of large intestine without hemorrhage 09/25/2012  Malignant neoplasm of prostate (HCC) 05/31/2012    ONSET DATE: 2 year ago  REFERRING DIAG: R26.89 (ICD-10-CM) - Balance problem   THERAPY DIAG:  Abnormality of gait and mobility  Difficulty in walking, not elsewhere classified  Muscle weakness (generalized)  Unsteadiness on feet  Other abnormalities of gait and mobility  Rationale for Evaluation and Treatment: Rehabilitation  SUBJECTIVE:                                                                                                                                                                                             SUBJECTIVE STATEMENT:  Patient reports doing well with no imbalance or issues since last visit. HEP has been getting easier  with exception of EC balance.   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 Pt has had progressively worsening balance since the, likely due to his inner ear being removed. Pt has had one fall in the past 6 months due to his dog leash but he also has a lot of stumbles. 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 Pt has difficulty bending down to pick up droppings recently. Pt has difficulty with walking straight down the hallway. Pt has vertigo once every several years but is not the usual source of hi problems.   PAIN:  Are you having pain? No  PRECAUTIONS: Fall  RED FLAGS: None   WEIGHT BEARING RESTRICTIONS: No  FALLS: Has patient fallen in last 6 months? Yes. Number of falls 1  LIVING ENVIRONMENT: Lives with: lives with their family and lives with their spouse Lives in: House/apartment Stairs: Yes: External: 5 steps; can reach both Has following equipment at home: Single point cane and Walker - 4 wheeled  PLOF: Independent, Independent with basic ADLs, and Independent with household mobility with device  PATIENT GOALS: improve balance, be able to walk dog more confidently   OBJECTIVE:  Note: Objective measures were completed at Evaluation unless otherwise noted.  DIAGNOSTIC FINDINGS: n/a  COGNITION: Overall cognitive status: Within functional limits for tasks assessed       LOWER EXTREMITY MMT:    MMT Right Eval Left Eval  Hip flexion 4 4  Hip extension    Hip abduction    Hip adduction    Hip internal rotation    Hip external rotation    Knee flexion    Knee extension  Ankle dorsiflexion    Ankle plantarflexion    Ankle inversion    Ankle eversion    (Blank rows = not tested)  BED MOBILITY:  Not tested  TRANSFERS: Sit to stand:  Complete Independence  Assistive device utilized: None     Stand to sit: Complete Independence  Assistive device utilized: None     Chair to chair: Complete Independence  Assistive device utilized: None       RAMP:  Not tested  CURB:  Not tested  STAIRS: Findings: Number of Stairs: 4, Height of Stairs: 6in   , and Comments: B UE  GAIT: Findings: Distance walked: 30 ft and Comments: slow speed, short step length   FUNCTIONAL TESTS:  5 times sit to stand: 24.47 sec 10 meter walk test: .98m/s Dynamic Gait Index: Dynamic Gait Index  Mark the lowest level that applies.       Score Interpretation: Score of <19 indicates high risk of falls.  Minimally Clinically Important Difference (MCID):  =DGI scores of<21/24 = 1.80 points DGI scores of >21/24 = 0.60 points   Hewitt T, Inbar-Borovsky N, Brozgol M, Giladi N, [place] JM. The Dynamic Gait Index in healthy older adults: the role of stair climbing, fear of falling and gender. Gait Posture. 2009 Feb;29(2):237-41. doi: 10.1016/j.gaitpost.2008.08.013. Epub 2008 Oct 8. PMID: 81154560; PMCID: EFR7290501.  Pardasaney, MYRTIS LOIS Bonus, GEANNIE POUR., et al. (2012). Sensitivity to change and responsiveness of four balance measures for community-dwelling older adults. Physical therapy 92(3): 388-397.   PATIENT SURVEYS:  ABC scale: The Activities-Specific Balance Confidence (ABC) Scale 0% 10 20 30  40 50 60 70 80 90 100% No confidence<->completely confident  How confident are you that you will not lose your balance or become unsteady when you . . .   ABC scale: The Activities-Specific Balance Confidence (ABC) Scale   No confidence<->completely confident     How confident are you that you will not lose your balance or become unsteady when you . . .       Date tested    1: Walk around the house 90   2. Walk up or down stairs 70   3. Bend over and pick up a slipper from in front of a closet floor 70   4. Reach for a small can off a shelf at  eye level 100   5. Stand on tip toes and reach for something above your head 90   6. Stand on a chair and reach for something 20   7. Sweep the floor 90   8. Walk outside the house to a car parked in the driveway 100   9. Get into or out of a car 100   10. Walk across a parking lot to the mall 80   11. Walk up or down a ramp 50   12. Walk in a crowded mall where people rapidly walk past you 80   13. Are bumped into by people as you walk through the mall 60   14. Step onto or off of an escalator while you are holding onto the railing 60   15. Step onto or off an escalator while holding onto parcels such that you cannot hold onto the railing 40   16. Walk outside on icy sidewalks 0   Total: #/16 1100 0.6875    68.75 %  TREATMENT DATE: 06/11/24    NMR: To facilitate reeducation of movement, balance, posture, coordination, and/or proprioception/kinesthetic sense. Particular focus on challenging all 3 balance systems to sharpen each due to his vestibular on R side.   Wide tandem on airex pads 3 x 30 sec ea LE - rare UE use but still needed with a few LOB incidences.   Sidestepping on and off airex x 10 ea side without UE support   Gait with EC and feedback throughout and after each rep x 8 reps- inconsistent drift and incorrect perception of drift direction. Did improve somewhat with increased repetitions.   EC balance on airex pad normal  BOS 3 x 30 sec   1 LE on floor other on 6 in step 3 x 30 sec ea LE with superior and inferior head turns   Stance on rocker board a/p direction - 3 x 30 sec - improved with practice from very unsteady to moderately unsteady   Unless otherwise stated, CGA was provided and gait belt donned in order to ensure pt safety   PATIENT EDUCATION: Access Code: [email or web address] URL: [email or web address] Date:  06/02/2024 Prepared by: Lonni Gainer  Exercises - Standing eyes closed at supportive surface   - 1 x daily - 7 x weekly - 3 sets - 30 second  hold - Standing Heel Raise  - 1 x daily - 7 x weekly - 3 sets - 12 reps - Toe Raises with Counter Support  - 1 x daily - 7 x weekly - 3 sets - 10 reps - Tandem balance at Counter Support   - 1 x daily - 7 x weekly - 3 sets - 30 seconds hold   HOME EXERCISE PROGRAM:   GOALS: Goals reviewed with patient? Yes  SHORT TERM GOALS: Target date: 06/18/2024   Patient will be independent in home exercise program to improve strength/mobility for better functional independence with ADLs. Baseline: No HEP currently  Goal status: INITIAL  LONG TERM GOALS: Target date: 07/16/2024   1.  Patient will complete five times sit to stand test in < 15 seconds indicating an increased LE strength and improved balance. Baseline: 24.47 sec no UE Goal status: INITIAL  2.  Patient will improve ABC score to 78%   to demonstrate statistically significant improvement in mobility and quality of life as it relates to their Balance and mobility Goal status: INITIAL   3.  Patient will increase DGI Balance score by > 6 points to demonstrate decreased fall risk during functional activities. Baseline: 12 Goal status: INITIAL    4. Patient will increase 10 meter walk test to >1.64m/s as to improve gait speed for better community ambulation and to reduce fall risk. Baseline: .78 m/s Goal status: INITIAL  5.   Patient will demonstrate ability to bend down to floor level and pick up objects x 10 repetitions on unstable or unlevel surface to indicate improved ability to care for dog. Baseline: Not confident doing this at eval per report Goal status: INITIAL    ASSESSMENT:  CLINICAL IMPRESSION:  The patient presents with balance impairments consistent with right-sided vestibular loss following cancer surgery. During todays session, he demonstrated difficulty integrating  sensory systems for postural control, particularly during eyes-closed tasks, uneven surfaces, and dynamic challenges. Although he required intermittent upper-extremity support and experienced several mild losses of balance, he showed meaningful improvement with repetition,  where his stability and directional awareness became more consistent over time. His performance indicates that while compensation from visual and somatosensory  systems is emerging, these systems fatigue quickly and require continued structured training to improve reliability. Continued physical therapy remains beneficial to further enhance his balance confidence, decrease fall risk, and strengthen his ability to compensate for permanent vestibular deficits. Ongoing neuromuscular reeducation will help refine sensory integration, improve postural responses, and support safer mobility in daily activities.   OBJECTIVE IMPAIRMENTS: Abnormal gait, decreased activity tolerance, decreased balance, and decreased strength.   ACTIVITY LIMITATIONS: squatting, stairs, and locomotion level  PARTICIPATION LIMITATIONS: community activity and yard work  PERSONAL FACTORS: Age, Time since onset of injury/illness/exacerbation, and 1 comorbidity: R ear canal removal due to cancerous mass  are also affecting patient's functional outcome.   REHAB POTENTIAL: Good  CLINICAL DECISION MAKING: Stable/uncomplicated  EVALUATION COMPLEXITY: Low  PLAN:  PT FREQUENCY: 2x/week  PT DURATION: 8 weeks  PLANNED INTERVENTIONS: 97750- Physical Performance Testing, 97110-Therapeutic exercises, 97530- Therapeutic activity, 97112- Neuromuscular re-education, 97535- Self Care, [id number]- Manual therapy, [phone]- Gait training, [phone]- Canalith repositioning, Patient/Family education, Balance training, Stair training, and Vestibular training  PLAN FOR NEXT SESSION:  continue balance training - update  HEP as needed - some functional strength as needed  -activities  related to dog and integration of compliant surfaces   -pulling cable machine, compliant surface, functional strength   -increasing challenge of balance systems to accound for loss of vestibular input   Lonni KATHEE Gainer, PT 06/11/2024, 1:21 PM        "

## 2024-06-16 ENCOUNTER — Ambulatory Visit: Admitting: Physical Therapy

## 2024-06-19 ENCOUNTER — Ambulatory Visit: Admitting: Physical Therapy

## 2024-06-19 DIAGNOSIS — R269 Unspecified abnormalities of gait and mobility: Secondary | ICD-10-CM

## 2024-06-19 DIAGNOSIS — R2681 Unsteadiness on feet: Secondary | ICD-10-CM

## 2024-06-19 DIAGNOSIS — R2689 Other abnormalities of gait and mobility: Secondary | ICD-10-CM

## 2024-06-19 DIAGNOSIS — R262 Difficulty in walking, not elsewhere classified: Secondary | ICD-10-CM

## 2024-06-19 DIAGNOSIS — M6281 Muscle weakness (generalized): Secondary | ICD-10-CM

## 2024-06-19 NOTE — Therapy (Signed)
 " OUTPATIENT PHYSICAL THERAPY NEURO TREATMENT   Patient Name: Patrick Shepherd MRN: [id number] DOB:09-09-1945, 79 y.o., male Today's Date: 06/19/2024   PCP: Alla Amis, MD  REFERRING PROVIDER: Alla Amis, MD   END OF SESSION:        Past Medical History:  Diagnosis Date   BPH (benign prostatic hyperplasia)    Diverticulosis    Dysplastic nevus 06/09/2021   right post deltoid- Severe, excised 07/19/21   GERD (gastroesophageal reflux disease)    GIB (gastrointestinal bleeding)    History of blood transfusion    History of SCC (squamous cell carcinoma) of skin 12/26/2017   left anterior shoulder   Hx of squamous cell carcinoma of skin 06/20/2017   R mid lateral forearm,   Macular hole    OAB (overactive bladder)    Sleep apnea    tested at [facility]   Spontaneous pneumothorax    Squamous cell carcinoma of skin 06/30/2017   right mid lat forearm   Squamous cell carcinoma of skin 04/16/2019   left post shoulder   Squamous cell carcinoma of skin 07/26/2021   right buccal cheek, EDC   Tobacco abuse    Past Surgical History:  Procedure Laterality Date   25 GAUGE PARS PLANA VITRECTOMY WITH 20 GAUGE MVR PORT FOR MACULAR HOLE Left 04/21/2014   Procedure: 25 GAUGE PARS PLANA VITRECTOMY WITH 20 GAUGE MVR PORT FOR MACULAR HOLE;  Surgeon: Norleen JONETTA Ku, MD;  Location: [facility] OR;  Service: Ophthalmology;  Laterality: Left;   collapsed lung     back in 1968   COLONOSCOPY     CYST EXCISION     from his back x 2   GAS/FLUID EXCHANGE Left 04/21/2014   Procedure: GAS/FLUID EXCHANGE;  Surgeon: Norleen JONETTA Ku, MD;  Location: 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, right 03/01/2022   Combined arterial insufficiency and corporo-venous occlusive erectile dysfunction 01/29/2017   Mixed hyperlipidemia 11/29/2016   Smokes cigars 11/27/2016   OAB (overactive bladder) 01/26/2016   Rectal bleeding 11/24/2015   Chest pain 11/24/2015   Perineal abscess 11/24/2015   GIB (gastrointestinal bleeding) 11/24/2015   BPH (benign prostatic hyperplasia)    GERD (gastroesophageal reflux disease)    Sleep apnea    Diverticulosis    Macular hole, left eye 04/21/2014   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 HEP has been getting easier with exception of EC balance.   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 Pt has had progressively worsening balance since the, likely due to his inner ear being removed. Pt has had  one fall in the past 6 months due to his dog leash but he also has a lot of stumbles. 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 Pt has difficulty bending down to pick up droppings recently. Pt has difficulty with walking straight down the hallway. Pt has vertigo once every several years but is not the usual source of hi problems.   PAIN:  Are you having pain? No  PRECAUTIONS: Fall  RED FLAGS: None   WEIGHT BEARING RESTRICTIONS: No  FALLS: Has patient fallen in last 6 months? Yes. Number of falls 1  LIVING ENVIRONMENT: Lives with: lives with their family and lives with their spouse Lives in: House/apartment Stairs: Yes: External: 5 steps; can reach both Has following equipment at home: Single point cane and Walker - 4 wheeled  PLOF: Independent, Independent with basic ADLs, and Independent with household mobility with device  PATIENT GOALS: improve balance, be able to walk dog more confidently   OBJECTIVE:  Note: Objective measures were completed at Evaluation unless otherwise noted.  DIAGNOSTIC FINDINGS: n/a  COGNITION: Overall cognitive status: Within functional limits for tasks assessed       LOWER EXTREMITY MMT:    MMT Right Eval Left Eval  Hip flexion 4 4  Hip extension    Hip abduction    Hip adduction    Hip internal rotation    Hip external rotation    Knee flexion    Knee extension    Ankle dorsiflexion    Ankle plantarflexion    Ankle inversion    Ankle eversion    (Blank rows = not tested)  BED MOBILITY:  Not tested  TRANSFERS: Sit to stand: Complete Independence  Assistive device utilized: None     Stand to sit: Complete Independence  Assistive device utilized: None     Chair to chair: Complete Independence  Assistive device utilized: None       RAMP:  Not tested  CURB:  Not tested  STAIRS: Findings: Number of Stairs: 4, Height of Stairs: 6in   , and Comments: B UE  GAIT: Findings: Distance walked: 30 ft and Comments: slow speed,  short step length   FUNCTIONAL TESTS:  5 times sit to stand: 24.47 sec 10 meter walk test: .51m/s Dynamic Gait Index: Dynamic Gait Index  Mark the lowest level that applies.       Score Interpretation: Score of <19 indicates high risk of falls.  Minimally Clinically Important Difference (MCID):  =DGI scores of<21/24 = 1.80 points DGI scores of >21/24 = 0.60 points   Wheatley T, Inbar-Borovsky N, Brozgol M, Giladi N, [place] JM. The Dynamic Gait Index in healthy older adults: the role of stair climbing, fear of falling and gender. Gait Posture. 2009 Feb;29(2):237-41. doi: 10.1016/j.gaitpost.2008.08.013. Epub 2008 Oct 8. PMID: 81154560; PMCID: EFR7290501.  Pardasaney, MYRTIS LOIS Bonus, GEANNIE POUR., et al. (2012). Sensitivity to change and responsiveness of four balance measures for  community-dwelling older adults. Physical therapy 92(3): 388-397.   PATIENT SURVEYS:  ABC scale: The Activities-Specific Balance Confidence (ABC) Scale 0% 10 20 30  40 50 60 70 80 90 100% No confidence<->completely confident  How confident are you that you will not lose your balance or become unsteady when you . . .   ABC scale: The Activities-Specific Balance Confidence (ABC) Scale   No confidence<->completely confident     How confident are you that you will not lose your balance or become unsteady when you . . .       Date tested    1: Walk around the house 90   2. Walk up or down stairs 70   3. Bend over and pick up a slipper from in front of a closet floor 70   4. Reach for a small can off a shelf at eye level 100   5. Stand on tip toes and reach for something above your head 90   6. Stand on a chair and reach for something 20   7. Sweep the floor 90   8. Walk outside the house to a car parked in the driveway 100   9. Get into or out of a car 100   10. Walk across a parking lot to the mall 80   11. Walk up or down a ramp 50   12. Walk in a crowded mall where people rapidly walk past you 80   13.  Are bumped into by people as you walk through the mall 60   14. Step onto or off of an escalator while you are holding onto the railing 60   15. Step onto or off an escalator while holding onto parcels such that you cannot hold onto the railing 40   16. Walk outside on icy sidewalks 0   Total: #/16 1100 0.6875    68.75 %                                                                                                                                 TREATMENT DATE: 06/19/24   NMR: To facilitate reeducation of movement, balance, posture, coordination, and/or proprioception/kinesthetic sense. Particular focus on challenging all 3 balance systems to sharpen each due to his vestibular on R side.   EC balance on airex pad 3 x 30 sec   Sidestepping on and off airex x 10 ea side without UE support   Sidestepping on airex beam 2 x 5 laps - instability noted but improved with practice   Gait in long hallway with dual task letter finding and head turns- cues to maintain pace as best as able.   Stance on rocker board a/p direction - 3 x 30 sec - added head turns up and dow to increase challenge 2 rounds of 10 sup/ inf head nods   Stance on rocker board lateral orientation and challenged to alternate shifting weight wile maintaining balance 2  x 30 sec   Activity Description: on airex pad turning to tap appropriately lit blaze pod.  Activity Setting:  random Number of Pods:  4 Cycles/Sets:  4 Duration (Time or Hit Count):  1 min  Unless otherwise stated, CGA was provided and gait belt donned in order to ensure pt safety   PATIENT EDUCATION: Access Code: [email or web address] URL: [email or web address] Date: 06/02/2024 Prepared by: Lonni Gainer  Exercises - Standing eyes closed at supportive surface   - 1 x daily - 7 x weekly - 3 sets - 30 second  hold - Standing Heel Raise  - 1 x daily - 7 x weekly - 3 sets - 12 reps - Toe Raises with Counter Support  - 1 x daily - 7 x weekly - 3 sets  - 10 reps - Tandem balance at Counter Support   - 1 x daily - 7 x weekly - 3 sets - 30 seconds hold   HOME EXERCISE PROGRAM:   GOALS: Goals reviewed with patient? Yes  SHORT TERM GOALS: Target date: 06/18/2024   Patient will be independent in home exercise program to improve strength/mobility for better functional independence with ADLs. Baseline: No HEP currently  Goal status: INITIAL  LONG TERM GOALS: Target date: 07/16/2024   1.  Patient will complete five times sit to stand test in < 15 seconds indicating an increased LE strength and improved balance. Baseline: 24.47 sec no UE Goal status: INITIAL  2.  Patient will improve ABC score to 78%   to demonstrate statistically significant improvement in mobility and quality of life as it relates to their Balance and mobility Goal status: INITIAL   3.  Patient will increase DGI Balance score by > 6 points to demonstrate decreased fall risk during functional activities. Baseline: 12 Goal status: INITIAL    4. Patient will increase 10 meter walk test to >1.80m/s as to improve gait speed for better community ambulation and to reduce fall risk. Baseline: .78 m/s Goal status: INITIAL  5.   Patient will demonstrate ability to bend down to floor level and pick up objects x 10 repetitions on unstable or unlevel surface to indicate improved ability to care for dog. 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 Todays session emphasized neuromuscular reeducation to challenge and strengthen all three balance systems in order to compensate for his unilateral vestibular loss. The patient demonstrated instability during higher level tasks such as sidestepping on the airex beam and dynamic gait activities with dual task demands, but  he improved with practice and cuing to maintain pace and posture. He tolerated eyes closed activities on unstable surfaces, rocker board training with multidirectional head movements, and Blaze Pod reaction tasks, all of which required significant postural adjustments and sensory integration.  Continued physical therapy is beneficial because the patient continues to demonstrate impaired balance reactions, reliance on visual and somatosensory cues, and difficulty adapting to dynamic environments. Ongoing skilled intervention will help him refine compensatory strategies, improve safety during gait and daily activities, and reduce fall risk through targeted strengthening of his remaining balance systems.   OBJECTIVE IMPAIRMENTS: Abnormal gait, decreased activity tolerance, decreased balance, and decreased strength.   ACTIVITY LIMITATIONS: squatting, stairs, and locomotion level  PARTICIPATION LIMITATIONS: community activity and yard work  PERSONAL FACTORS: Age, Time  since onset of injury/illness/exacerbation, and 1 comorbidity: R ear canal removal due to cancerous mass  are also affecting patient's functional outcome.   REHAB POTENTIAL: Good  CLINICAL DECISION MAKING: Stable/uncomplicated  EVALUATION COMPLEXITY: Low  PLAN:  PT FREQUENCY: 2x/week  PT DURATION: 8 weeks  PLANNED INTERVENTIONS: 97750- Physical Performance Testing, 97110-Therapeutic exercises, 97530- Therapeutic activity, 97112- Neuromuscular re-education, 97535- Self Care, [id number]- Manual therapy, [phone]- Gait training, [phone]- Canalith repositioning, Patient/Family education, Balance training, Stair training, and Vestibular training  PLAN FOR NEXT SESSION:  continue balance training - update  HEP as needed - some functional strength as needed  -activities related to dog and integration of compliant surfaces   -pulling cable machine, compliant surface, functional strength   -increasing challenge of balance systems to accound for  loss of vestibular input   Lonni KATHEE Gainer, PT 06/19/2024, 2:45 PM        "

## 2024-06-23 ENCOUNTER — Ambulatory Visit: Admitting: Physical Therapy

## 2024-06-26 ENCOUNTER — Ambulatory Visit: Admitting: Physical Therapy

## 2024-06-30 ENCOUNTER — Ambulatory Visit: Admitting: Physical Therapy

## 2024-07-02 ENCOUNTER — Ambulatory Visit: Admitting: Physical Therapy

## 2024-07-07 ENCOUNTER — Ambulatory Visit: Admitting: Physical Therapy

## 2024-07-09 ENCOUNTER — Ambulatory Visit: Admitting: Physical Therapy

## 2024-07-14 ENCOUNTER — Ambulatory Visit: Admitting: Physical Therapy

## 2024-07-16 ENCOUNTER — Ambulatory Visit: Admitting: Physical Therapy

## 2024-07-21 ENCOUNTER — Ambulatory Visit: Admitting: Physical Therapy

## 2024-07-23 ENCOUNTER — Ambulatory Visit: Admitting: Physical Therapy

## 2024-07-28 ENCOUNTER — Ambulatory Visit: Admitting: Physical Therapy

## 2024-07-29 ENCOUNTER — Ambulatory Visit: Admitting: Dermatology

## 2024-07-30 ENCOUNTER — Ambulatory Visit: Admitting: Physical Therapy

## 2024-08-04 ENCOUNTER — Ambulatory Visit: Admitting: Physical Therapy

## 2024-08-06 ENCOUNTER — Ambulatory Visit: Admitting: Physical Therapy

## 2024-08-11 ENCOUNTER — Ambulatory Visit: Admitting: Physical Therapy

## 2024-08-13 ENCOUNTER — Ambulatory Visit: Admitting: Physical Therapy

## 2024-08-18 ENCOUNTER — Ambulatory Visit: Admitting: Physical Therapy

## 2024-08-20 ENCOUNTER — Ambulatory Visit: Admitting: Physical Therapy

## 2024-08-25 ENCOUNTER — Ambulatory Visit: Admitting: Physical Therapy

## 2024-08-27 ENCOUNTER — Ambulatory Visit: Admitting: Physical Therapy

## 2024-09-01 ENCOUNTER — Ambulatory Visit: Admitting: Physical Therapy

## 2024-09-03 ENCOUNTER — Ambulatory Visit: Admitting: Physical Therapy

## 2024-09-08 ENCOUNTER — Ambulatory Visit: Admitting: Physical Therapy

## 2024-09-10 ENCOUNTER — Ambulatory Visit: Admitting: Physical Therapy

## 2024-09-15 ENCOUNTER — Ambulatory Visit: Admitting: Physical Therapy

## 2024-09-17 ENCOUNTER — Ambulatory Visit: Admitting: Physical Therapy

## 2024-09-22 ENCOUNTER — Ambulatory Visit: Admitting: Physical Therapy

## 2024-09-24 ENCOUNTER — Ambulatory Visit: Admitting: Physical Therapy

## 2024-09-29 ENCOUNTER — Ambulatory Visit: Admitting: Physical Therapy

## 2024-10-01 ENCOUNTER — Ambulatory Visit: Admitting: Physical Therapy

## 2024-10-10 ENCOUNTER — Other Ambulatory Visit

## 2024-10-14 ENCOUNTER — Ambulatory Visit: Admitting: Physician Assistant
